# Patient Record
Sex: Female | Born: 1960 | Race: White | Hispanic: No | Marital: Married | Smoking: Former smoker
Health system: Southern US, Community
[De-identification: ages and names within clinical notes are randomized; demographics above are authoritative.]

## PROBLEM LIST (undated history)

## (undated) DIAGNOSIS — M1990 Unspecified osteoarthritis, unspecified site: Secondary | ICD-10-CM

## (undated) DIAGNOSIS — G5600 Carpal tunnel syndrome, unspecified upper limb: Secondary | ICD-10-CM

## (undated) DIAGNOSIS — A6000 Herpesviral infection of urogenital system, unspecified: Secondary | ICD-10-CM

## (undated) DIAGNOSIS — Z87898 Personal history of other specified conditions: Secondary | ICD-10-CM

## (undated) DIAGNOSIS — R51 Headache: Secondary | ICD-10-CM

## (undated) DIAGNOSIS — R519 Headache, unspecified: Secondary | ICD-10-CM

## (undated) DIAGNOSIS — I209 Angina pectoris, unspecified: Secondary | ICD-10-CM

## (undated) DIAGNOSIS — K219 Gastro-esophageal reflux disease without esophagitis: Secondary | ICD-10-CM

## (undated) DIAGNOSIS — K589 Irritable bowel syndrome without diarrhea: Secondary | ICD-10-CM

## (undated) DIAGNOSIS — G4731 Primary central sleep apnea: Secondary | ICD-10-CM

## (undated) DIAGNOSIS — N83209 Unspecified ovarian cyst, unspecified side: Secondary | ICD-10-CM

## (undated) DIAGNOSIS — G43909 Migraine, unspecified, not intractable, without status migrainosus: Secondary | ICD-10-CM

## (undated) DIAGNOSIS — I10 Essential (primary) hypertension: Secondary | ICD-10-CM

## (undated) DIAGNOSIS — R0600 Dyspnea, unspecified: Secondary | ICD-10-CM

## (undated) DIAGNOSIS — J329 Chronic sinusitis, unspecified: Secondary | ICD-10-CM

## (undated) HISTORY — DX: Personal history of other specified conditions: Z87.898

## (undated) HISTORY — DX: Essential (primary) hypertension: I10

## (undated) HISTORY — DX: Carpal tunnel syndrome, unspecified upper limb: G56.00

## (undated) HISTORY — DX: Irritable bowel syndrome, unspecified: K58.9

## (undated) HISTORY — PX: TUBAL LIGATION: SHX77

## (undated) HISTORY — DX: Chronic sinusitis, unspecified: J32.9

## (undated) HISTORY — DX: Unspecified ovarian cyst, unspecified side: N83.209

## (undated) HISTORY — DX: Herpesviral infection of urogenital system, unspecified: A60.00

## (undated) HISTORY — DX: Primary central sleep apnea: G47.31

## (undated) HISTORY — DX: Gastro-esophageal reflux disease without esophagitis: K21.9

## (undated) HISTORY — DX: Unspecified osteoarthritis, unspecified site: M19.90

## (undated) HISTORY — DX: Migraine, unspecified, not intractable, without status migrainosus: G43.909

---
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
## 1984-02-18 HISTORY — PX: CERVICAL CONE BIOPSY: SUR198

## 1999-02-18 HISTORY — PX: ENDOMETRIAL ABLATION: SHX621

## 2000-02-18 HISTORY — PX: TOTAL ABDOMINAL HYSTERECTOMY: SHX209

## 2002-02-17 HISTORY — PX: RHINOPLASTY: SUR1284

## 2003-02-18 HISTORY — PX: ABDOMINAL HYSTERECTOMY: SHX81

## 2003-11-21 ENCOUNTER — Ambulatory Visit: Payer: Self-pay | Admitting: Obstetrics and Gynecology

## 2004-02-18 HISTORY — PX: OTHER SURGICAL HISTORY: SHX169

## 2004-03-21 ENCOUNTER — Ambulatory Visit (HOSPITAL_BASED_OUTPATIENT_CLINIC_OR_DEPARTMENT_OTHER): Admission: RE | Admit: 2004-03-21 | Discharge: 2004-03-21 | Payer: Self-pay | Admitting: Orthopedic Surgery

## 2004-04-24 ENCOUNTER — Ambulatory Visit (HOSPITAL_BASED_OUTPATIENT_CLINIC_OR_DEPARTMENT_OTHER): Admission: RE | Admit: 2004-04-24 | Discharge: 2004-04-24 | Payer: Self-pay | Admitting: Orthopedic Surgery

## 2004-11-26 ENCOUNTER — Ambulatory Visit: Payer: Self-pay | Admitting: Obstetrics and Gynecology

## 2004-12-06 ENCOUNTER — Ambulatory Visit: Payer: Self-pay | Admitting: Obstetrics and Gynecology

## 2005-06-30 ENCOUNTER — Ambulatory Visit: Payer: Self-pay | Admitting: Obstetrics and Gynecology

## 2005-07-04 ENCOUNTER — Ambulatory Visit: Payer: Self-pay | Admitting: Obstetrics and Gynecology

## 2005-12-16 ENCOUNTER — Ambulatory Visit: Payer: Self-pay | Admitting: Obstetrics and Gynecology

## 2006-12-21 ENCOUNTER — Ambulatory Visit: Payer: Self-pay | Admitting: Obstetrics and Gynecology

## 2008-01-11 ENCOUNTER — Ambulatory Visit: Payer: Self-pay | Admitting: Obstetrics and Gynecology

## 2008-04-24 ENCOUNTER — Emergency Department: Payer: Self-pay | Admitting: Internal Medicine

## 2009-05-02 ENCOUNTER — Ambulatory Visit: Payer: Self-pay

## 2009-07-30 ENCOUNTER — Ambulatory Visit: Payer: Self-pay | Admitting: Gastroenterology

## 2009-08-14 ENCOUNTER — Emergency Department: Payer: Self-pay | Admitting: Emergency Medicine

## 2009-08-30 ENCOUNTER — Ambulatory Visit: Payer: Self-pay | Admitting: Internal Medicine

## 2010-05-15 ENCOUNTER — Ambulatory Visit: Payer: Self-pay | Admitting: Internal Medicine

## 2011-05-07 ENCOUNTER — Ambulatory Visit: Payer: Self-pay | Admitting: Gastroenterology

## 2011-05-19 ENCOUNTER — Ambulatory Visit: Payer: Self-pay | Admitting: Internal Medicine

## 2012-02-24 ENCOUNTER — Encounter: Payer: Self-pay | Admitting: Internal Medicine

## 2012-02-24 ENCOUNTER — Ambulatory Visit (INDEPENDENT_AMBULATORY_CARE_PROVIDER_SITE_OTHER): Payer: BC Managed Care – PPO | Admitting: Internal Medicine

## 2012-02-24 VITALS — BP 120/82 | HR 62 | Temp 97.8°F | Ht 62.5 in | Wt 142.2 lb

## 2012-02-24 DIAGNOSIS — K219 Gastro-esophageal reflux disease without esophagitis: Secondary | ICD-10-CM

## 2012-02-24 DIAGNOSIS — I10 Essential (primary) hypertension: Secondary | ICD-10-CM

## 2012-03-08 ENCOUNTER — Encounter: Payer: Self-pay | Admitting: Internal Medicine

## 2012-03-08 NOTE — Progress Notes (Signed)
  Subjective:    Patient ID: Debra Dean, female    DOB: 1960-06-15, 52 y.o.   MRN: [id number]  HPI 52 year old female with past history of allergy and sinus problems, IBS, GERD and hypertension who comes in today for a scheduled follow up.  She states she is doing well.  Handling stress well.  Increased stress with her job.  No significant increased heart rate or palpitations.  No chest pain.  Acid reflux controlled.  Bowels stable.   Past Medical History  Diagnosis Date  . Hypertension   . GERD (gastroesophageal reflux disease)   . IBS (irritable bowel syndrome)   . Recurrent sinusitis   . Carpal tunnel syndrome     mild-mod left, mild right  . Central sleep apnea   . History of abnormal Pap smear     Current Outpatient Prescriptions on File Prior to Visit  Medication Sig Dispense Refill  . amLODipine (NORVASC) 2.5 MG tablet Take 2.5 mg by mouth daily.      Marland Kitchen azelastine (ASTELIN) 137 MCG/SPRAY nasal spray Place 1 spray into the nose 2 (two) times daily. Use in each nostril as directed      . dexlansoprazole (DEXILANT) 60 MG capsule Take 60 mg by mouth daily.      . fluticasone (FLONASE) 50 MCG/ACT nasal spray Place 2 sprays into the nose daily.      . metoprolol tartrate (LOPRESSOR) 25 MG tablet Take 12.5 mg by mouth every morning.      . triamterene-hydrochlorothiazide (MAXZIDE-25) 37.5-25 MG per tablet 1/2 tablet q day        Review of Systems Patient denies any headache, lightheadedness or dizziness. No significant sinus or allergy symptoms.  No chest pain, tightness or palpitations.  No increased shortness of breath, cough or congestion.  No nausea or vomiting.  No increased acid reflux.  Controlled.  No abdominal pain or cramping.  No bowel change, such as diarrhea, constipation, BRBPR or melana.  No urine change.        Objective:   Physical Exam Filed Vitals:   02/24/12 1429  BP: 120/82  Pulse: 62  Temp: 97.8 F (9.29 C)   52 year old female in no acute distress.     HEENT:  Nares - clear.  OP- without lesions or erythema.  NECK:  Supple, nontender.  No audible bruit.   HEART:  Appears to be regular. LUNGS:  Without crackles or wheezing audible.  Respirations even and unlabored.   RADIAL PULSE:  Equal bilaterally.  ABDOMEN:  Soft, nontender.  No audible abdominal bruit.   EXTREMITIES:  No increased edema to be present.                   Assessment & Plan:  PULMONARY.  Last CXR 01/04/10 - clear.  Sleep study - some central sleep apnea.  Discussed with Dr Meredeth Ide.  Avoid sedating medication.  Follow.    CARDIOVASCULAR.  Stable.  Improved.  Follow.    HEALTH MAINTENANCE.  Sees GYN for her pelvic/paps.  Bone density 03/12/10 - normal.  Mammogram 05/19/11 - Birads II.  She is planning to follow up with [facility].  States this is better regarding her insurance.

## 2012-03-08 NOTE — Assessment & Plan Note (Signed)
Blood pressure under good control.  Same med regimen.  Follow.

## 2012-03-08 NOTE — Assessment & Plan Note (Signed)
Symptoms controlled.  Same med regimen.  Follow.

## 2012-09-01 ENCOUNTER — Ambulatory Visit: Payer: Self-pay

## 2013-12-14 ENCOUNTER — Ambulatory Visit: Payer: Self-pay

## 2015-04-05 ENCOUNTER — Other Ambulatory Visit: Payer: Self-pay | Admitting: Unknown Physician Specialty

## 2015-04-05 DIAGNOSIS — Z1231 Encounter for screening mammogram for malignant neoplasm of breast: Secondary | ICD-10-CM

## 2015-04-17 ENCOUNTER — Ambulatory Visit: Payer: Self-pay

## 2015-04-23 ENCOUNTER — Ambulatory Visit
Admission: RE | Admit: 2015-04-23 | Discharge: 2015-04-23 | Disposition: A | Discharge status: Home / Self Care | Payer: Managed Care, Other (non HMO) | Source: Ambulatory Visit | Attending: Unknown Physician Specialty | Admitting: Unknown Physician Specialty

## 2015-04-23 ENCOUNTER — Other Ambulatory Visit: Payer: Self-pay | Admitting: Unknown Physician Specialty

## 2015-04-23 DIAGNOSIS — Z1231 Encounter for screening mammogram for malignant neoplasm of breast: Secondary | ICD-10-CM | POA: Diagnosis not present

## 2016-03-03 ENCOUNTER — Ambulatory Visit: Admit: 2016-03-03 | Payer: Managed Care, Other (non HMO) | Admitting: Gastroenterology

## 2016-03-03 SURGERY — COLONOSCOPY WITH PROPOFOL
Anesthesia: General

## 2016-03-21 ENCOUNTER — Other Ambulatory Visit: Payer: Self-pay | Admitting: Gastroenterology

## 2016-03-21 DIAGNOSIS — R1310 Dysphagia, unspecified: Secondary | ICD-10-CM

## 2016-03-26 ENCOUNTER — Ambulatory Visit: Payer: Managed Care, Other (non HMO)

## 2016-03-26 ENCOUNTER — Ambulatory Visit
Admission: RE | Admit: 2016-03-26 | Discharge: 2016-03-26 | Disposition: A | Discharge status: Home / Self Care | Payer: Managed Care, Other (non HMO) | Source: Ambulatory Visit | Attending: Gastroenterology | Admitting: Gastroenterology

## 2016-03-26 DIAGNOSIS — R1310 Dysphagia, unspecified: Secondary | ICD-10-CM | POA: Diagnosis not present

## 2016-03-27 ENCOUNTER — Other Ambulatory Visit: Payer: Self-pay | Admitting: Gastroenterology

## 2016-03-27 DIAGNOSIS — R1032 Left lower quadrant pain: Secondary | ICD-10-CM

## 2016-03-28 ENCOUNTER — Other Ambulatory Visit: Payer: Managed Care, Other (non HMO)

## 2016-04-07 ENCOUNTER — Ambulatory Visit: Payer: Managed Care, Other (non HMO)

## 2016-04-18 ENCOUNTER — Ambulatory Visit: Payer: Managed Care, Other (non HMO)

## 2016-05-28 ENCOUNTER — Other Ambulatory Visit: Payer: Self-pay | Admitting: Infectious Diseases

## 2016-05-28 DIAGNOSIS — Z1231 Encounter for screening mammogram for malignant neoplasm of breast: Secondary | ICD-10-CM

## 2016-06-20 ENCOUNTER — Other Ambulatory Visit: Payer: Self-pay | Admitting: Neurology

## 2016-06-20 ENCOUNTER — Encounter: Payer: Self-pay | Admitting: *Deleted

## 2016-06-20 DIAGNOSIS — R531 Weakness: Secondary | ICD-10-CM

## 2016-06-23 ENCOUNTER — Ambulatory Visit: Payer: Managed Care, Other (non HMO) | Admitting: Anesthesiology

## 2016-06-23 ENCOUNTER — Encounter: Payer: Self-pay | Admitting: *Deleted

## 2016-06-23 ENCOUNTER — Encounter
Admission: RE | Disposition: A | Discharge status: Home / Self Care | Payer: Self-pay | Source: Ambulatory Visit | Attending: Gastroenterology

## 2016-06-23 ENCOUNTER — Ambulatory Visit
Admission: RE | Admit: 2016-06-23 | Discharge: 2016-06-23 | Disposition: A | Discharge status: Home / Self Care | Payer: Managed Care, Other (non HMO) | Source: Ambulatory Visit | Attending: Gastroenterology | Admitting: Gastroenterology

## 2016-06-23 DIAGNOSIS — Z8673 Personal history of transient ischemic attack (TIA), and cerebral infarction without residual deficits: Secondary | ICD-10-CM | POA: Insufficient documentation

## 2016-06-23 DIAGNOSIS — J329 Chronic sinusitis, unspecified: Secondary | ICD-10-CM | POA: Insufficient documentation

## 2016-06-23 DIAGNOSIS — Z888 Allergy status to other drugs, medicaments and biological substances status: Secondary | ICD-10-CM | POA: Insufficient documentation

## 2016-06-23 DIAGNOSIS — K2980 Duodenitis without bleeding: Secondary | ICD-10-CM | POA: Insufficient documentation

## 2016-06-23 DIAGNOSIS — Z79899 Other long term (current) drug therapy: Secondary | ICD-10-CM | POA: Insufficient documentation

## 2016-06-23 DIAGNOSIS — K589 Irritable bowel syndrome without diarrhea: Secondary | ICD-10-CM | POA: Insufficient documentation

## 2016-06-23 DIAGNOSIS — G709 Myoneural disorder, unspecified: Secondary | ICD-10-CM | POA: Insufficient documentation

## 2016-06-23 DIAGNOSIS — K2950 Unspecified chronic gastritis without bleeding: Secondary | ICD-10-CM | POA: Insufficient documentation

## 2016-06-23 DIAGNOSIS — K5730 Diverticulosis of large intestine without perforation or abscess without bleeding: Secondary | ICD-10-CM | POA: Insufficient documentation

## 2016-06-23 DIAGNOSIS — Z86010 Personal history of colonic polyps: Secondary | ICD-10-CM | POA: Diagnosis not present

## 2016-06-23 DIAGNOSIS — K228 Other specified diseases of esophagus: Secondary | ICD-10-CM | POA: Diagnosis not present

## 2016-06-23 DIAGNOSIS — K210 Gastro-esophageal reflux disease with esophagitis: Secondary | ICD-10-CM | POA: Diagnosis not present

## 2016-06-23 DIAGNOSIS — R194 Change in bowel habit: Secondary | ICD-10-CM | POA: Diagnosis present

## 2016-06-23 DIAGNOSIS — I10 Essential (primary) hypertension: Secondary | ICD-10-CM | POA: Diagnosis not present

## 2016-06-23 DIAGNOSIS — K625 Hemorrhage of anus and rectum: Secondary | ICD-10-CM | POA: Insufficient documentation

## 2016-06-23 DIAGNOSIS — D123 Benign neoplasm of transverse colon: Secondary | ICD-10-CM | POA: Insufficient documentation

## 2016-06-23 DIAGNOSIS — Z87891 Personal history of nicotine dependence: Secondary | ICD-10-CM | POA: Insufficient documentation

## 2016-06-23 DIAGNOSIS — Z885 Allergy status to narcotic agent status: Secondary | ICD-10-CM | POA: Insufficient documentation

## 2016-06-23 DIAGNOSIS — K219 Gastro-esophageal reflux disease without esophagitis: Secondary | ICD-10-CM | POA: Diagnosis present

## 2016-06-23 DIAGNOSIS — Z90710 Acquired absence of both cervix and uterus: Secondary | ICD-10-CM | POA: Diagnosis not present

## 2016-06-23 DIAGNOSIS — Z9889 Other specified postprocedural states: Secondary | ICD-10-CM | POA: Insufficient documentation

## 2016-06-23 DIAGNOSIS — G4731 Primary central sleep apnea: Secondary | ICD-10-CM | POA: Insufficient documentation

## 2016-06-23 DIAGNOSIS — K640 First degree hemorrhoids: Secondary | ICD-10-CM | POA: Diagnosis not present

## 2016-06-23 HISTORY — DX: Angina pectoris, unspecified: I20.9

## 2016-06-23 HISTORY — PX: COLONOSCOPY WITH PROPOFOL: SHX5780

## 2016-06-23 HISTORY — PX: ESOPHAGOGASTRODUODENOSCOPY (EGD) WITH PROPOFOL: SHX5813

## 2016-06-23 HISTORY — DX: Headache, unspecified: R51.9

## 2016-06-23 HISTORY — DX: Headache: R51

## 2016-06-23 HISTORY — DX: Dyspnea, unspecified: R06.00

## 2016-06-23 SURGERY — COLONOSCOPY WITH PROPOFOL
Anesthesia: General

## 2016-06-23 MED ORDER — FENTANYL CITRATE (PF) 100 MCG/2ML IJ SOLN
INTRAMUSCULAR | Status: DC | PRN
  Administered 2016-06-23 (×2): 50 ug via INTRAVENOUS

## 2016-06-23 MED ORDER — GLYCOPYRROLATE 0.2 MG/ML IJ SOLN
INTRAMUSCULAR | Status: DC | PRN
  Administered 2016-06-23: 0.1 mg via INTRAVENOUS

## 2016-06-23 MED ORDER — MIDAZOLAM HCL 2 MG/2ML IJ SOLN
INTRAMUSCULAR | Status: AC
  Filled 2016-06-23: qty 2

## 2016-06-23 MED ORDER — LIDOCAINE HCL (CARDIAC) 20 MG/ML IV SOLN
INTRAVENOUS | Status: DC | PRN
  Administered 2016-06-23: 30 mg via INTRAVENOUS

## 2016-06-23 MED ORDER — PROPOFOL 10 MG/ML IV BOLUS
INTRAVENOUS | Status: AC
  Filled 2016-06-23: qty 20

## 2016-06-23 MED ORDER — SODIUM CHLORIDE 0.9 % IV SOLN: INTRAVENOUS | Status: DC

## 2016-06-23 MED ORDER — SODIUM CHLORIDE 0.9 % IV SOLN
INTRAVENOUS | Status: DC
  Administered 2016-06-23: 07:00:00 via INTRAVENOUS

## 2016-06-23 MED ORDER — PROPOFOL 500 MG/50ML IV EMUL
INTRAVENOUS | Status: DC | PRN
  Administered 2016-06-23: 120 ug/kg/min via INTRAVENOUS

## 2016-06-23 MED ORDER — FENTANYL CITRATE (PF) 100 MCG/2ML IJ SOLN
INTRAMUSCULAR | Status: AC
  Filled 2016-06-23: qty 2

## 2016-06-23 MED ORDER — PROPOFOL 500 MG/50ML IV EMUL
INTRAVENOUS | Status: AC
  Filled 2016-06-23: qty 50

## 2016-06-23 MED ORDER — MIDAZOLAM HCL 2 MG/2ML IJ SOLN
INTRAMUSCULAR | Status: DC | PRN
  Administered 2016-06-23: 2 mg via INTRAVENOUS

## 2016-06-23 NOTE — H&P (Signed)
Outpatient short stay form Pre-procedure 06/23/2016 7:41 AM Lollie Sails MD  Primary Physician: Dr. Adrian Prows  Reason for visit:  EGD and colonoscopy  History of present illness:  Patient is a 56 year old female presenting today as above. She has personal history of esophageal reflux and has noted over the past year or so that she's had to take antiacids more frequently and almost every evening. Has been taking her rabeprazole daily however in discussion with her she is likely not taking it to get the best affect from the medication. We discussed at length today. She also had an episode of some rectal bleeding in the setting of known history of adenomatous colon polyps. Been 1 episode but it was marked for her. She also has some symptoms sound like irritable bowel. She tolerated her prep well. She takes no aspirin or blood thinning agents.    Current Facility-Administered Medications:  .  0.9 %  sodium chloride infusion, , Intravenous, Continuous, Lollie Sails, MD, Last Rate: 20 mL/hr at 06/23/16 5035 .  0.9 %  sodium chloride infusion, , Intravenous, Continuous, Lollie Sails, MD  Prescriptions Prior to Admission  Medication Sig Dispense Refill Last Dose  . amLODipine (NORVASC) 2.5 MG tablet Take 2.5 mg by mouth daily.   06/23/2016 at Unknown time  . cholecalciferol (VITAMIN D) 400 units TABS tablet Take 1,000 Units by mouth.   Past Week at Unknown time  . fluticasone (FLONASE) 50 MCG/ACT nasal spray Place 2 sprays into the nose daily.   06/22/2016 at Unknown time  . metoprolol tartrate (LOPRESSOR) 25 MG tablet Take 12.5 mg by mouth every morning.   06/23/2016 at Unknown time  . potassium chloride (K-DUR) 10 MEQ tablet Take 10 mEq by mouth daily.   06/22/2016 at Unknown time  . RABEprazole (ACIPHEX) 20 MG tablet Take 20 mg by mouth daily.   06/22/2016 at Unknown time  . triamterene-hydrochlorothiazide (MAXZIDE-25) 37.5-25 MG per tablet 1/2 tablet q day   06/23/2016 at Unknown time   . ALPRAZolam (XANAX) 0.25 MG tablet 1/2 tablet qhs prn   Not Taking at Unknown time  . azelastine (ASTELIN) 137 MCG/SPRAY nasal spray Place 1 spray into the nose 2 (two) times daily. Use in each nostril as directed   Not Taking at Unknown time  . dexlansoprazole (DEXILANT) 60 MG capsule Take 60 mg by mouth daily.   Not Taking at Unknown time     Allergies  Allergen Reactions  . Codeine   . Ace Inhibitors Rash     Past Medical History:  Diagnosis Date  . Anginal pain ([place])   . Carpal tunnel syndrome    mild-mod left, mild right  . Central sleep apnea   . Dyspnea    had stress test and negative having another sleep study and MRI of brain  . GERD (gastroesophageal reflux disease)   . Headache   . History of abnormal Pap smear   . Hypertension   . IBS (irritable bowel syndrome)   . Recurrent sinusitis     Review of systems:      Physical Exam    Heart and lungs: Regular rate and rhythm without rub or gallop, lungs are bilaterally clear.    HEENT: Normocephalic atraumatic eyes are anicteric    Other:     Pertinant exam for procedure: Soft nontender nondistended bowel sounds positive normoactive.    Planned proceedures: EGD, colonoscopy and indicated procedures. I have discussed the risks benefits and complications of procedures to include  not limited to bleeding, infection, perforation and the risk of sedation and the patient wishes to proceed.    Lollie Sails, MD Gastroenterology 06/23/2016  7:41 AM

## 2016-06-23 NOTE — Op Note (Addendum)
[facility] Gastroenterology Patient Name: Debra Dean Procedure Date: 06/23/2016 7:40 AM MRN: [id number] Account #: [id number] Date of Birth: October 04, 1960 Admit Type: Outpatient Age: 56 Room: [facility] ENDO ROOM 3 Gender: Female Note Status: Finalized Procedure:            Colonoscopy Indications:          Rectal bleeding, Personal history of colonic polyps Providers:            Lollie Sails, MD Referring MD:         No Local Md, MD (Referring MD) Complications:        No immediate complications. Procedure:            Pre-Anesthesia Assessment:                       - ASA Grade Assessment: II - A patient with mild                        systemic disease.                       After obtaining informed consent, the colonoscope was                        passed under direct vision. Throughout the procedure,                        the patient's blood pressure, pulse, and oxygen                        saturations were monitored continuously. The                        Colonoscope was introduced through the anus and                        advanced to the the cecum, identified by appendiceal                        orifice and ileocecal valve. The colonoscopy was                        performed without difficulty. The patient tolerated the                        procedure well. The quality of the bowel preparation                        was good. Findings:      A few small-mouthed diverticula were found in the sigmoid colon and       descending colon.      A 2 mm polyp was found in the hepatic flexure. The polyp was sessile.       The polyp was removed with a cold biopsy forceps. Resection and       retrieval were complete.      Biopsies for histology were taken with a cold forceps from the right       colon and left colon for evaluation of microscopic colitis.      Non-bleeding internal hemorrhoids were found during retroflexion. The       hemorrhoids were small  and  Grade I (internal hemorrhoids that do not       prolapse).      No additional abnormalities were found on retroflexion. Impression:           - Diverticulosis in the sigmoid colon and in the                        descending colon.                       - One 2 mm polyp at the hepatic flexure, removed with a                        cold biopsy forceps. Resected and retrieved.                       - Non-bleeding internal hemorrhoids.                       - Biopsies were taken with a cold forceps from the                        right colon and left colon for evaluation of                        microscopic colitis. Recommendation:       - Discharge patient to home. Procedure Code(s):    --- Professional ---                       [phone], Colonoscopy, flexible; with biopsy, single or                        multiple Diagnosis Code(s):    --- Professional ---                       K64.0, First degree hemorrhoids                       D12.3, Benign neoplasm of transverse colon (hepatic                        flexure or splenic flexure)                       K62.5, Hemorrhage of anus and rectum                       Z86.010, Personal history of colonic polyps                       K57.30, Diverticulosis of large intestine without                        perforation or abscess without bleeding CPT copyright 2016 American Medical Association. All rights reserved. The codes documented in this report are preliminary and upon coder review may  be revised to meet current compliance requirements. Lollie Sails, MD 06/23/2016 8:30:56 AM This report has been signed electronically. Number of Addenda: 0 Note Initiated On: 06/23/2016 7:40 AM Scope Withdrawal Time: 0 hours 8 minutes 34 seconds  Total Procedure Duration: 0 hours 15 minutes 58 seconds  [facility]

## 2016-06-23 NOTE — Anesthesia Postprocedure Evaluation (Signed)
Anesthesia Post Note  Patient: Debra Dean  Procedure(s) Performed: Procedure(s) (LRB): COLONOSCOPY WITH PROPOFOL (N/A) ESOPHAGOGASTRODUODENOSCOPY (EGD) WITH PROPOFOL (N/A)  Patient location during evaluation: Endoscopy Anesthesia Type: General Level of consciousness: awake and alert Pain management: pain level controlled Vital Signs Assessment: post-procedure vital signs reviewed and stable Respiratory status: spontaneous breathing, nonlabored ventilation, respiratory function stable and patient connected to nasal cannula oxygen Cardiovascular status: blood pressure returned to baseline and stable Postop Assessment: no signs of nausea or vomiting Anesthetic complications: no     Last Vitals:  Vitals:   06/23/16 0857 06/23/16 0907  BP: 120/75 106/60  Pulse: (!) 51 65  Resp: 12 13  Temp:      Last Pain:  Vitals:   06/23/16 0837  TempSrc: Tympanic                 Precious Haws Ashlen Kiger

## 2016-06-23 NOTE — Op Note (Signed)
[facility] Gastroenterology Patient Name: Debra Dean Procedure Date: 06/23/2016 7:41 AM MRN: [id number] Account #: [id number] Date of Birth: January 21, 1961 Admit Type: Outpatient Age: 56 Room: [facility] ENDO ROOM 3 Gender: Female Note Status: Finalized Procedure:            Upper GI endoscopy Indications:          Esophageal reflux symptoms that persist despite                        appropriate therapy Providers:            Lollie Sails, MD Referring MD:         No Local Md, MD (Referring MD) Medicines:            Monitored Anesthesia Care Complications:        No immediate complications. Procedure:            Pre-Anesthesia Assessment:                       - ASA Grade Assessment: II - A patient with mild                        systemic disease.                       After obtaining informed consent, the endoscope was                        passed under direct vision. Throughout the procedure,                        the patient's blood pressure, pulse, and oxygen                        saturations were monitored continuously. The Endoscope                        was introduced through the mouth, and advanced to the                        third part of duodenum. The upper GI endoscopy was                        accomplished without difficulty. The patient tolerated                        the procedure well. Findings:      The Z-line was variable. Biopsies were taken with a cold forceps for       histology.      The exam of the esophagus was otherwise normal.      patulous GE junction      Patchy mild inflammation characterized by erosions and erythema was       found in the gastric antrum. Biopsies were taken with a cold forceps for       histology. Biopsies were taken with a cold forceps for Helicobacter       pylori testing.      The cardia and gastric fundus were normal on retroflexion.      Patchy mild inflammation characterized by congestion (edema) and        erythema was found  in the duodenal bulb. Impression:           - Z-line variable. Biopsied.                       - Erosive gastritis. Biopsied.                       - Duodenitis. Recommendation:       - Await pathology results.                       - Continue present medications.                       - Return to GI clinic in 3 weeks. Procedure Code(s):    --- Professional ---                       [phone], Esophagogastroduodenoscopy, flexible, transoral;                        with biopsy, single or multiple Diagnosis Code(s):    --- Professional ---                       K22.8, Other specified diseases of esophagus                       K29.60, Other gastritis without bleeding                       K29.80, Duodenitis without bleeding                       K21.9, Gastro-esophageal reflux disease without                        esophagitis CPT copyright 2016 American Medical Association. All rights reserved. The codes documented in this report are preliminary and upon coder review may  be revised to meet current compliance requirements. Lollie Sails, MD 06/23/2016 8:08:09 AM This report has been signed electronically. Number of Addenda: 0 Note Initiated On: 06/23/2016 7:41 AM      [facility]

## 2016-06-23 NOTE — Anesthesia Procedure Notes (Signed)
Performed by: COOK-MARTIN, Rochella Benner Pre-anesthesia Checklist: Patient identified, Emergency Drugs available, Suction available, Patient being monitored and Timeout performed Patient Re-evaluated:Patient Re-evaluated prior to inductionOxygen Delivery Method: Nasal cannula Preoxygenation: Pre-oxygenation with 100% oxygen Intubation Type: IV induction Airway Equipment and Method: Bite block Placement Confirmation: CO2 detector and positive ETCO2     

## 2016-06-23 NOTE — Anesthesia Post-op Follow-up Note (Cosign Needed)
Anesthesia QCDR form completed.        

## 2016-06-23 NOTE — Anesthesia Preprocedure Evaluation (Signed)
Anesthesia Evaluation  Patient identified by MRN, date of birth, ID band Patient awake    Reviewed: Allergy & Precautions, H&P , NPO status , Patient's Chart, lab work & pertinent test results  History of Anesthesia Complications Negative for: history of anesthetic complications  Airway Mallampati: III  TM Distance: <3 FB Neck ROM: full    Dental  (+) Chipped, Caps   Pulmonary shortness of breath and with exertion, former smoker,    Pulmonary exam normal breath sounds clear to auscultation       Cardiovascular Exercise Tolerance: Good hypertension, (-) anginaNormal cardiovascular exam Rhythm:regular Rate:Normal     Neuro/Psych  Headaches, TIA Neuromuscular disease negative psych ROS   GI/Hepatic Neg liver ROS, GERD  Controlled,  Endo/Other  negative endocrine ROS  Renal/GU negative Renal ROS  negative genitourinary   Musculoskeletal   Abdominal   Peds  Hematology negative hematology ROS (+)   Anesthesia Other Findings Past Medical History: No date: Anginal pain ([facility]) No date: Carpal tunnel syndrome     Comment: mild-mod left, mild right No date: Central sleep apnea No date: Dyspnea     Comment: had stress test and negative having another               sleep study and MRI of brain No date: GERD (gastroesophageal reflux disease) No date: Headache No date: History of abnormal Pap smear No date: Hypertension No date: IBS (irritable bowel syndrome) No date: Recurrent sinusitis  Past Surgical History: 2005: ABDOMINAL HYSTERECTOMY 2006: bilateral carpal tunnel release 2004: RHINOPLASTY 1965: TONSILLECTOMY No date: TUBAL LIGATION  BMI    Body Mass Index:  24.48 kg/m      Reproductive/Obstetrics negative OB ROS                             Anesthesia Physical Anesthesia Plan  ASA: III  Anesthesia Plan: General   Post-op Pain Management:    Induction: Intravenous  Airway  Management Planned: Natural Airway and Nasal Cannula  Additional Equipment:   Intra-op Plan:   Post-operative Plan:   Informed Consent: I have reviewed the patients History and Physical, chart, labs and discussed the procedure including the risks, benefits and alternatives for the proposed anesthesia with the patient or authorized representative who has indicated his/her understanding and acceptance.   Dental Advisory Given  Plan Discussed with: Anesthesiologist, CRNA and Surgeon  Anesthesia Plan Comments: (Patient consented for risks of anesthesia including but not limited to:  - adverse reactions to medications - damage to teeth, lips or other oral mucosa - sore throat or hoarseness - Damage to heart, brain, lungs or loss of life  Patient voiced understanding.)        Anesthesia Quick Evaluation

## 2016-06-23 NOTE — Transfer of Care (Signed)
Immediate Anesthesia Transfer of Care Note  Patient: Debra Dean  Procedure(s) Performed: Procedure(s): COLONOSCOPY WITH PROPOFOL (N/A) ESOPHAGOGASTRODUODENOSCOPY (EGD) WITH PROPOFOL (N/A)  Patient Location: PACU  Anesthesia Type:General  Level of Consciousness: awake and sedated  Airway & Oxygen Therapy: Patient Spontanous Breathing and Patient connected to nasal cannula oxygen  Post-op Assessment: Report given to RN and Post -op Vital signs reviewed and stable  Post vital signs: Reviewed and stable  Last Vitals:  Vitals:   06/23/16 0653  BP: (!) 123/91  Pulse: 72  Resp: 16  Temp: (!) 36 C    Last Pain:  Vitals:   06/23/16 0653  TempSrc: Oral         Complications: No apparent anesthesia complications

## 2016-06-24 ENCOUNTER — Ambulatory Visit
Admission: RE | Admit: 2016-06-24 | Discharge: 2016-06-24 | Disposition: A | Discharge status: Home / Self Care | Payer: Managed Care, Other (non HMO) | Source: Ambulatory Visit | Attending: Infectious Diseases | Admitting: Infectious Diseases

## 2016-06-24 ENCOUNTER — Encounter: Payer: Self-pay | Admitting: Gastroenterology

## 2016-06-24 DIAGNOSIS — Z1231 Encounter for screening mammogram for malignant neoplasm of breast: Secondary | ICD-10-CM | POA: Diagnosis present

## 2016-06-24 LAB — SURGICAL PATHOLOGY

## 2016-07-01 ENCOUNTER — Ambulatory Visit (INDEPENDENT_AMBULATORY_CARE_PROVIDER_SITE_OTHER): Payer: Managed Care, Other (non HMO) | Admitting: Obstetrics and Gynecology

## 2016-07-01 ENCOUNTER — Encounter: Payer: Self-pay | Admitting: Obstetrics and Gynecology

## 2016-07-01 VITALS — BP 122/66 | HR 66 | Ht 62.0 in | Wt 142.0 lb

## 2016-07-01 DIAGNOSIS — Z01419 Encounter for gynecological examination (general) (routine) without abnormal findings: Secondary | ICD-10-CM | POA: Diagnosis not present

## 2016-07-01 DIAGNOSIS — N951 Menopausal and female climacteric states: Secondary | ICD-10-CM

## 2016-07-01 NOTE — Progress Notes (Signed)
Chief Complaint  Patient presents with  . Gynecologic Exam    lumpiness in belly    HPI:      Debra Dean is a 56 y.o. G1P1 who LMP was No LMP recorded. Patient has had a hysterectomy., presents today for her annual examination.  Her menses are absent due to hysterectomy. She does not have intermenstrual bleeding.  She does not have vasomotor sx.   Sex activity: single partner, contraception - status post hysterectomy. She does have vaginal dryness and sometimes a small amount of red blood with sex. She has tried [place] with some relief. She can't tolerate oral, topical or patch ERT due to headaches.  Last Pap: April 03, 2015  Results were: no abnormalities /neg HPV DNA.   Last mammogram: 06/24/16. Results were: normal--routine follow-up in 12 months There is a FH of breast cancer in her mat aunt, genetic testing not indicated. There is no FH of ovarian cancer. The patient does not do self-breast exams.  Colonoscopy: colonoscopy last wk with 1 polyp. Due again in 5 yrs.    Tobacco use: The patient denies current or previous tobacco use. Alcohol use: none Exercise: not active  She does get adequate calcium and Vitamin D in her diet. She has labs with PCP.  Past Medical History:  Diagnosis Date  . Anginal pain ([place])   . Carpal tunnel syndrome    mild-mod left, mild right  . Central sleep apnea   . Dyspnea    had stress test and negative having another sleep study and MRI of brain  . GERD (gastroesophageal reflux disease)   . Headache   . History of abnormal Pap smear   . Hypertension   . IBS (irritable bowel syndrome)   . Recurrent sinusitis     Past Surgical History:  Procedure Laterality Date  . ABDOMINAL HYSTERECTOMY  2005  . bilateral carpal tunnel release  2006  . COLONOSCOPY WITH PROPOFOL N/A 06/23/2016   Procedure: COLONOSCOPY WITH PROPOFOL;  Surgeon: Lollie Sails, MD;  Location: [facility] ENDOSCOPY;  Service: Endoscopy;  Laterality: N/A;  .  ESOPHAGOGASTRODUODENOSCOPY (EGD) WITH PROPOFOL N/A 06/23/2016   Procedure: ESOPHAGOGASTRODUODENOSCOPY (EGD) WITH PROPOFOL;  Surgeon: Lollie Sails, MD;  Location: [facility] ENDOSCOPY;  Service: Endoscopy;  Laterality: N/A;  . RHINOPLASTY  2004  . TONSILLECTOMY  1965  . TUBAL LIGATION      Family History  Problem Relation Age of Onset  . Hypertension Mother   . Endometriosis Sister   . Asthma Brother   . Colon cancer Unknown        grandfather  . Uterine cancer Unknown        aunt  . Breast cancer Maternal Aunt        70's    Social History   Social History  . Marital status: Married    Spouse name: N/A  . Number of children: 1  . Years of education: N/A   Occupational History  . Not on file.   Social History Main Topics  . Smoking status: Former Smoker    Quit date: 02/18/1995  . Smokeless tobacco: Never Used  . Alcohol use No  . Drug use: No  . Sexual activity: Yes   Other Topics Concern  . Not on file   Social History Narrative  . No narrative on file     Current Outpatient Prescriptions:  .  ALPRAZolam (XANAX) 0.25 MG tablet, 1/2 tablet qhs prn, Disp: , Rfl:  .  amLODipine (  NORVASC) 2.5 MG tablet, Take 2.5 mg by mouth daily., Disp: , Rfl:  .  azelastine (ASTELIN) 137 MCG/SPRAY nasal spray, Place 1 spray into the nose 2 (two) times daily. Use in each nostril as directed, Disp: , Rfl:  .  cholecalciferol (VITAMIN D) 400 units TABS tablet, Take 1,000 Units by mouth., Disp: , Rfl:  .  dexlansoprazole (DEXILANT) 60 MG capsule, Take 60 mg by mouth daily., Disp: , Rfl:  .  fluticasone (FLONASE) 50 MCG/ACT nasal spray, Place 2 sprays into the nose daily., Disp: , Rfl:  .  metoprolol tartrate (LOPRESSOR) 25 MG tablet, Take 12.5 mg by mouth every morning., Disp: , Rfl:  .  potassium chloride (K-DUR) 10 MEQ tablet, Take 10 mEq by mouth daily., Disp: , Rfl:  .  RABEprazole (ACIPHEX) 20 MG tablet, Take 20 mg by mouth daily., Disp: , Rfl:  .  triamterene-hydrochlorothiazide  (MAXZIDE-25) 37.5-25 MG per tablet, 1/2 tablet q day, Disp: , Rfl:    ROS:  Review of Systems  Constitutional: Positive for fatigue. Negative for fever and unexpected weight change.  Respiratory: Positive for shortness of breath and wheezing. Negative for cough.   Cardiovascular: Negative for chest pain, palpitations and leg swelling.  Gastrointestinal: Negative for blood in stool, constipation, diarrhea, nausea and vomiting.  Endocrine: Negative for cold intolerance, heat intolerance and polyuria.  Genitourinary: Positive for dyspareunia and vaginal bleeding. Negative for dysuria, flank pain, frequency, genital sores, hematuria, menstrual problem, pelvic pain, urgency, vaginal discharge and vaginal pain.  Musculoskeletal: Negative for back pain, joint swelling and myalgias.  Skin: Negative for rash.  Neurological: Negative for dizziness, syncope, light-headedness, numbness and headaches.  Hematological: Negative for adenopathy.  Psychiatric/Behavioral: Negative for agitation, confusion, sleep disturbance and suicidal ideas. The patient is not nervous/anxious.      Objective: BP 122/66 (BP Location: Left Arm, Patient Position: Sitting, Cuff Size: Normal)   Pulse 66   Ht 5\' 2"  (1.575 m)   Wt 142 lb (64.4 kg)   BMI 25.97 kg/m    Physical Exam  Constitutional: She is oriented to person, place, and time. She appears well-developed and well-nourished.  Genitourinary: Vagina normal. There is no rash or tenderness on the right labia. There is no rash or tenderness on the left labia. No erythema or tenderness in the vagina. No vaginal discharge found. Right adnexum does not display mass and does not display tenderness. Left adnexum does not display mass and does not display tenderness.  Genitourinary Comments: UTERUS/CX SURG ABSENT  Neck: Normal range of motion. No thyromegaly present.  Cardiovascular: Normal rate, regular rhythm and normal heart sounds.   No murmur  heard. Pulmonary/Chest: Effort normal and breath sounds normal. Right breast exhibits no mass, no nipple discharge, no skin change and no tenderness. Left breast exhibits no mass, no nipple discharge, no skin change and no tenderness.  Abdominal: Soft. There is no tenderness. There is no guarding.  Musculoskeletal: Normal range of motion.  Neurological: She is alert and oriented to person, place, and time. No cranial nerve deficit.  Psychiatric: She has a normal mood and affect. Her behavior is normal.  Vitals reviewed.   Assessment/Plan: Encounter for annual routine gynecological examination  Vaginal dryness, menopausal - Try coconut oil since can't tolerate vag ERT.         GYN counsel mammography screening, menopause, adequate intake of calcium and vitamin D, diet and exercise     F/U  Return in about 1 year (around 07/01/2017).  Maevis Mumby B. Thimothy Barretta, PA-C  07/01/2016 4:30 PM

## 2016-07-07 ENCOUNTER — Ambulatory Visit
Admission: RE | Admit: 2016-07-07 | Discharge: 2016-07-07 | Disposition: A | Discharge status: Home / Self Care | Payer: Managed Care, Other (non HMO) | Source: Ambulatory Visit | Attending: Neurology | Admitting: Neurology

## 2016-07-07 DIAGNOSIS — R531 Weakness: Secondary | ICD-10-CM

## 2016-07-07 MED ORDER — GADOBENATE DIMEGLUMINE 529 MG/ML IV SOLN
12.0000 mL | Freq: Once | INTRAVENOUS | Status: AC | PRN
  Administered 2016-07-07: 12 mL via INTRAVENOUS

## 2016-07-16 ENCOUNTER — Other Ambulatory Visit: Payer: Managed Care, Other (non HMO)

## 2016-09-03 ENCOUNTER — Ambulatory Visit: Payer: 59 | Attending: Neurology

## 2016-09-03 DIAGNOSIS — G4761 Periodic limb movement disorder: Secondary | ICD-10-CM | POA: Insufficient documentation

## 2016-09-03 DIAGNOSIS — Z1389 Encounter for screening for other disorder: Secondary | ICD-10-CM | POA: Diagnosis present

## 2017-08-05 ENCOUNTER — Other Ambulatory Visit: Payer: Self-pay | Admitting: Infectious Diseases

## 2017-08-05 DIAGNOSIS — Z1231 Encounter for screening mammogram for malignant neoplasm of breast: Secondary | ICD-10-CM

## 2017-08-26 ENCOUNTER — Ambulatory Visit
Admission: RE | Admit: 2017-08-26 | Discharge: 2017-08-26 | Disposition: A | Discharge status: Home / Self Care | Payer: 59 | Source: Ambulatory Visit | Attending: Infectious Diseases | Admitting: Infectious Diseases

## 2017-08-26 DIAGNOSIS — Z1231 Encounter for screening mammogram for malignant neoplasm of breast: Secondary | ICD-10-CM | POA: Diagnosis not present

## 2017-12-23 ENCOUNTER — Ambulatory Visit: Payer: 59 | Admitting: Obstetrics and Gynecology

## 2018-02-25 ENCOUNTER — Encounter: Payer: Managed Care, Other (non HMO) | Attending: Family Medicine | Admitting: Dietician

## 2018-02-25 ENCOUNTER — Encounter: Payer: Self-pay | Admitting: Dietician

## 2018-02-25 VITALS — Ht 62.5 in | Wt 145.1 lb

## 2018-02-25 DIAGNOSIS — Z6826 Body mass index (BMI) 26.0-26.9, adult: Secondary | ICD-10-CM | POA: Insufficient documentation

## 2018-02-25 DIAGNOSIS — R7303 Prediabetes: Secondary | ICD-10-CM | POA: Diagnosis not present

## 2018-02-25 DIAGNOSIS — Z713 Dietary counseling and surveillance: Secondary | ICD-10-CM | POA: Insufficient documentation

## 2018-02-25 NOTE — Progress Notes (Signed)
Medical Nutrition Therapy: Visit start time: 1630  end time: 1730  Assessment:  Diagnosis: pre-diabetes Past medical history: COPD, HTN, GERD Psychosocial issues/ stress concerns: none  Preferred learning method:  . Hands-on   Current weight: 145.1lbs Height: 5'2.5" Medications, supplements: reconciled list in medical record  Progress and evaluation: Patient reports HbA1C tested in December was 5.8%, will be retested in May. Reports strong family history for diabetes. Patient has been reducing intake of breads and sweets, carbohydrates; eating more fruits and vegetables and protein sources.   Physical activity: elliptical and pilates 10-20 minutes, 1 or more days a week (was doing 5 days a week). Plans to resume and wants to try 2x a day. Time on elliptical is limited due to COPD  Dietary Intake:  Usual eating pattern includes 3 meals and 2-3 snacks per day. Dining out frequency: 2-3 meals per week.(weekends)  Breakfast: egg omelet with veg, coffee Snack: decaf coffee at work; occasional snack of 1 small cookie (used to eat 4) Lunch: more salads recently with chicken strips (grilled), less croutons sub nuts, Ranch; or leftovers Snack: apples Supper: largest meal-- grilled pork chops with potato and salad; country style steak mashed potatoes or mac and cheese; corn peas, brussels sprouts broccoli, cabbage, rice Snack: occasional popcorn (1/2 bag); dark chocolate 2 squares; tries to limit chips cheetos Beverages: water, coffee with sugar and flavored creamer  Nutrition Care Education: Topics covered: diabetes prevention Basic nutrition: basic food groups, appropriate nutrient balance, appropriate meal and snack schedule, general nutrition guidelines    Weight control: benefits of weight control; estimated nutrition needs at about 1300kcal daily for some weight loss and provided guidance for carbohydrate and protein needs.  Diabetes: appropriate meal and snack schedule, appropriate carb  intake and balance, plate method for planning balanced meals, importance of low sugar and low fat food choices, role of protein and fiber. Importance of physical activity in controlling insulin resistance and body weight/ composition.   Nutritional Diagnosis:  [name]-2.2 Altered nutrition-related laboratory As related to pre-diabetes.  As evidenced by recent HbA1C of 5.8%.  Intervention:   Instruction as noted above.  Patient has already been working on positive and appropriate diet changes.   She is motivated to continue improving physical activity.   No follow-up needed at this time, patient will schedule later if needed.   Education Materials given:  . General diet guidelines for Diabetes (prevention) . Plate Planner with food lists . Sample meal pattern/ menus . Combination (and fast foods) list . Goals/ instructions   Learner/ who was taught:  . Patient   Level of understanding: Marland Kitchen Verbalizes/ demonstrates competency   Demonstrated degree of understanding via:   Teach back Learning barriers: . None  Willingness to learn/ readiness for change: . Eager, change in progress   Monitoring and Evaluation:  Dietary intake, exercise, BG control, and body weight      follow up: prn

## 2018-02-25 NOTE — Patient Instructions (Signed)
   Continue to plan balanced meals and control carbohydrate portions, great job!  Keep building regular exercise/ daily movement gradually as able to help control insulin resistance and diabetes risk.

## 2018-03-15 ENCOUNTER — Encounter: Payer: Self-pay | Admitting: Obstetrics and Gynecology

## 2018-03-15 ENCOUNTER — Ambulatory Visit (INDEPENDENT_AMBULATORY_CARE_PROVIDER_SITE_OTHER): Payer: Managed Care, Other (non HMO) | Admitting: Obstetrics and Gynecology

## 2018-03-15 VITALS — BP 140/68 | HR 73 | Ht 62.5 in | Wt 145.0 lb

## 2018-03-15 DIAGNOSIS — N760 Acute vaginitis: Secondary | ICD-10-CM

## 2018-03-15 LAB — POCT WET PREP WITH KOH
CLUE CELLS WET PREP PER HPF POC: NEGATIVE
KOH Prep POC: NEGATIVE
Trichomonas, UA: NEGATIVE
Yeast Wet Prep HPF POC: NEGATIVE

## 2018-03-15 MED ORDER — FLUCONAZOLE 150 MG PO TABS
150.0000 mg | ORAL_TABLET | Freq: Once | ORAL | 0 refills | Status: AC
Start: 2018-03-15 — End: 2018-03-15

## 2018-03-15 NOTE — Patient Instructions (Signed)
I value your feedback and entrusting us with your care. If you get a [facility] patient survey, I would appreciate you taking the time to let us know about your experience today. Thank you! 

## 2018-03-15 NOTE — Progress Notes (Signed)
[name], MD   Chief Complaint  Patient presents with  . Vaginitis    itching and burning, no discharge/odor, x 4 days just finished prednisone     HPI:      [name] is a 58 y.o. G1P1 who LMP was No LMP recorded. Patient has had a hysterectomy., presents today for vaginal itching/burning for 4 days without increased d/c, odor. Sx started after prednisone use. No recent abx use. Pt treated with monistat-1 ovule several nights ago with some sx improvement. Pt with hx of genital HSV but sx don't feel like that. No LBP, belly pain, fevers, no urin sx except dysuria.  Has annual sched 2/20.  Past Medical History:  Diagnosis Date  . Anginal pain ([place])   . Carpal tunnel syndrome    mild-mod left, mild right  . Central sleep apnea   . Dyspnea    had stress test and negative having another sleep study and MRI of brain  . GERD (gastroesophageal reflux disease)   . Headache   . Herpes genitalis   . History of abnormal Pap smear   . Hypertension   . IBS (irritable bowel syndrome)   . Migraine   . Osteoarthritis   . Ovarian cyst   . Recurrent sinusitis     Past Surgical History:  Procedure Laterality Date  . ABDOMINAL HYSTERECTOMY  2005  . bilateral carpal tunnel release  2006  . CERVICAL [place]E BIOPSY  1986  . COLONOSCOPY WITH PROPOFOL N/A 06/23/2016   Procedure: COLONOSCOPY WITH PROPOFOL;  Surgeon: Lollie Sails, MD;  Location: [facility] ENDOSCOPY;  Service: Endoscopy;  Laterality: N/A;  . ENDOMETRIAL ABLATION  2001  . ESOPHAGOGASTRODUODENOSCOPY (EGD) WITH PROPOFOL N/A 06/23/2016   Procedure: ESOPHAGOGASTRODUODENOSCOPY (EGD) WITH PROPOFOL;  Surgeon: Lollie Sails, MD;  Location: [facility] ENDOSCOPY;  Service: Endoscopy;  Laterality: N/A;  . RHINOPLASTY  2004  . TONSILLECTOMY  1965  . TOTAL ABDOMINAL HYSTERECTOMY  2002   LSO has right ovary and tube, MAD  . TUBAL LIGATION      Family History  Problem Relation Age of Onset  . Hypertension Mother   .  Endometriosis Sister   . Asthma Brother   . Colon cancer Other        grandfather  . Uterine cancer Other        aunt  . Breast cancer Maternal Aunt        70's  . Diabetes Father        DM Type 2  . Hypertension Father   . Prostate cancer Father   . Colon cancer Maternal Grandfather 69  . Uterine cancer Other 63       Malignant    Social History   Socioeconomic History  . Marital status: Married    Spouse name: Not on file  . Number of children: 1  . Years of education: Not on file  . Highest education level: Not on file  Occupational History  . Not on file  Social Needs  . Financial resource strain: Not on file  . Food insecurity:    Worry: Not on file    Inability: Not on file  . Transportation needs:    Medical: Not on file    Non-medical: Not on file  Tobacco Use  . Smoking status: Former Smoker    Last attempt to quit: 02/18/1995    Years since quitting: 23.0  . Smokeless tobacco: Never Used  Substance and Sexual Activity  .  Alcohol use: No  . Drug use: No  . Sexual activity: Yes    Birth control/protection: Surgical    Comment: Hysterectomy  Lifestyle  . Physical activity:    Days per week: Not on file    Minutes per session: Not on file  . Stress: Not on file  Relationships  . Social connections:    Talks on phone: Not on file    Gets together: Not on file    Attends religious service: Not on file    Active member of club or organization: Not on file    Attends meetings of clubs or organizations: Not on file    Relationship status: Not on file  . Intimate partner violence:    Fear of current or ex partner: Not on file    Emotionally abused: Not on file    Physically abused: Not on file    Forced sexual activity: Not on file  Other Topics Concern  . Not on file  Social History Narrative  . Not on file    Outpatient Medications Prior to Visit  Medication Sig Dispense Refill  . albuterol (PROVENTIL HFA;VENTOLIN HFA) 108 (90 Base) MCG/ACT  inhaler Inhale into the lungs.    Marland Kitchen aluminum hydroxide-magnesium carbonate (GAVISCON) 95-358 MG/15ML SUSP Take by mouth.    Marland Kitchen amLODipine (NORVASC) 2.5 MG tablet Take 2.5 mg by mouth daily.    . cholecalciferol (VITAMIN D) 400 units TABS tablet Take 1,000 Units by mouth.    . fluticasone (FLONASE) 50 MCG/ACT nasal spray Place 2 sprays into the nose daily.    . metoprolol succinate (TOPROL-XL) 25 MG 24 hr tablet TK 1/2 T PO QD    . potassium chloride (K-DUR) 10 MEQ tablet Take 10 mEq by mouth daily.    . RABEprazole (ACIPHEX) 20 MG tablet Take 20 mg by mouth daily.    . sodium chloride (OCEAN) 0.65 % nasal spray Place into the nose.    . triamterene-hydrochlorothiazide (MAXZIDE-25) 37.5-25 MG per tablet 1/2 tablet q day    . umeclidinium-vilanterol (ANORO ELLIPTA) 62.5-25 MCG/INH AEPB Inhale into the lungs.    . vitamin B-12 (CYANOCOBALAMIN) 1000 MCG tablet Take by mouth.    . baclofen (LIORESAL) 10 MG tablet Take by mouth.    . calcium-vitamin D (OSCAL WITH D) 500-200 MG-UNIT tablet Take by mouth.    . metoprolol tartrate (LOPRESSOR) 25 MG tablet Take 12.5 mg by mouth every morning.     No facility-administered medications prior to visit.       ROS:  Review of Systems  Constitutional: Negative for fever.  Gastrointestinal: Negative for blood in stool, constipation, diarrhea, nausea and vomiting.  Genitourinary: Positive for vaginal pain. Negative for dyspareunia, dysuria, flank pain, frequency, hematuria, urgency, vaginal bleeding and vaginal discharge.  Musculoskeletal: Negative for back pain.  Skin: Negative for rash.   BREAST: No symptoms   OBJECTIVE:   Vitals:  BP 140/68   Pulse 73   Ht 5' 2.5" (1.588 m)   Wt 145 lb (65.8 kg)   BMI 26.10 kg/m   Physical Exam Vitals signs reviewed.  Constitutional:      Appearance: She is well-developed.  Pulmonary:     Effort: Pulmonary effort is normal.  Genitourinary:    Pubic Area: No rash.      Labia:        Right: No rash,  tenderness or lesion.        Left: Lesion present. No rash or tenderness.  Vagina: Normal. No vaginal discharge, erythema or tenderness.     Uterus: Absent.      Adnexa: Right adnexa normal and left adnexa normal.       Right: No mass or tenderness.         Left: No mass or tenderness.       Comments: 2 SMALL POSSIBLY ULCERATED LESIONS VS EXCORIATIONS LT LABIA MINORA Musculoskeletal: Normal range of motion.  Neurological:     Mental Status: She is alert and oriented to person, place, and time.  Psychiatric:        Behavior: Behavior normal.        Thought Content: Thought content normal.     Results: Results for orders placed or performed in visit on 03/15/18 (from the past 24 hour(s))  POCT Wet Prep with KOH     Status: Normal   Collection Time: 03/15/18 11:52 AM  Result Value Ref Range   Trichomonas, UA Negative    Clue Cells Wet Prep HPF POC neg    Epithelial Wet Prep HPF POC     Yeast Wet Prep HPF POC neg    Bacteria Wet Prep HPF POC     RBC Wet Prep HPF POC     WBC Wet Prep HPF POC     KOH Prep POC Negative Negative     Assessment/Plan: Acute vaginitis - Neg wet prep, pos sx. Already treated with monistat-1. Rx diflucan. F/u prn. Could also be mild HSV outbreak that is resolving.  - Plan: fluconazole (DIFLUCAN) 150 MG tablet, POCT Wet Prep with KOH    Meds ordered this encounter  Medications  . fluconazole (DIFLUCAN) 150 MG tablet    Sig: Take 1 tablet (150 mg total) by mouth once for 1 dose.    Dispense:  1 tablet    Refill:  0    Order Specific Question:   Supervising Provider    Answer:   Gae Dry [[id number]]      Return if symptoms worsen or fail to improve.  Samwise Eckardt B. Margarite Vessel, PA-C 03/15/2018 11:54 AM

## 2018-03-28 NOTE — Progress Notes (Signed)
Chief Complaint  Patient presents with  . Gynecologic Exam    HPI:      [name] is a 58 y.o. G1P1 who LMP was No LMP recorded. Patient has had a hysterectomy., presents today for her annual examination.  Her menses are absent due to hysterectomy. She does not have intermenstrual bleeding.  She does not have vasomotor sx.   Sex activity: single partner, contraception - status post hysterectomy. She does have vaginal dryness and sometimes a small amount of red blood with sex. She has tried [place] with some relief. She can't tolerate oral, topical or patch ERT due to headaches. Is interested in non-estrogen tx.  Last Pap: April 03, 2015  Results were: no abnormalities /neg HPV DNA.  Hx of STDs: HSV Vag sx from 1/20 resolved.  Last mammogram: 08/26/17. Results were: normal--routine follow-up in 12 months There is a FH of breast cancer in her mat aunt, genetic testing not indicated. There is no FH of ovarian cancer. The patient does not do self-breast exams.  Colonoscopy: 2018 with 1 polyp. Repeat due after 5 yrs. Fh colon cancer in her MGF.  Tobacco use: The patient denies current or previous tobacco use. Alcohol use: none Exercise: not active  She does get adequate calcium and Vitamin D in her diet. She has labs with PCP.  Past Medical History:  Diagnosis Date  . Anginal pain ([place])   . Carpal tunnel syndrome    mild-mod left, mild right  . Central sleep apnea   . Dyspnea    had stress test and negative having another sleep study and MRI of brain  . GERD (gastroesophageal reflux disease)   . Headache   . Herpes genitalis   . History of abnormal Pap smear   . Hypertension   . IBS (irritable bowel syndrome)   . Migraine   . Osteoarthritis   . Ovarian cyst   . Recurrent sinusitis     Past Surgical History:  Procedure Laterality Date  . ABDOMINAL HYSTERECTOMY  2005  . bilateral carpal tunnel release  2006  . CERVICAL CONE BIOPSY  1986  . COLONOSCOPY WITH  PROPOFOL N/A 06/23/2016   Procedure: COLONOSCOPY WITH PROPOFOL;  Surgeon: Lollie Sails, MD;  Location: [facility] ENDOSCOPY;  Service: Endoscopy;  Laterality: N/A;  . ENDOMETRIAL ABLATION  2001  . ESOPHAGOGASTRODUODENOSCOPY (EGD) WITH PROPOFOL N/A 06/23/2016   Procedure: ESOPHAGOGASTRODUODENOSCOPY (EGD) WITH PROPOFOL;  Surgeon: Lollie Sails, MD;  Location: [facility] ENDOSCOPY;  Service: Endoscopy;  Laterality: N/A;  . RHINOPLASTY  2004  . TONSILLECTOMY  1965  . TOTAL ABDOMINAL HYSTERECTOMY  2002   LSO has right ovary and tube, MAD  . TUBAL LIGATION      Family History  Problem Relation Age of Onset  . Hypertension Mother   . Endometriosis Sister   . Asthma Brother   . Breast cancer Maternal Aunt        70's, tested and treated, has contact w her  . Diabetes Father        DM Type 2  . Hypertension Father   . Prostate cancer Father   . Colon cancer Maternal Grandfather 25  . Uterine cancer Maternal Aunt 63  . Prostate cancer Maternal Uncle        no mets  . Prostate cancer Maternal Uncle        no mets    Social History   Socioeconomic History  . Marital status: Married    Spouse name: Not  on file  . Number of children: 1  . Years of education: Not on file  . Highest education level: Not on file  Occupational History  . Not on file  Social Needs  . Financial resource strain: Not on file  . Food insecurity:    Worry: Not on file    Inability: Not on file  . Transportation needs:    Medical: Not on file    Non-medical: Not on file  Tobacco Use  . Smoking status: Former Smoker    Last attempt to quit: 02/18/1995    Years since quitting: 23.1  . Smokeless tobacco: Never Used  Substance and Sexual Activity  . Alcohol use: No  . Drug use: No  . Sexual activity: Yes    Birth control/protection: Surgical    Comment: Hysterectomy  Lifestyle  . Physical activity:    Days per week: Not on file    Minutes per session: Not on file  . Stress: Not on file  Relationships  .  Social connections:    Talks on phone: Not on file    Gets together: Not on file    Attends religious service: Not on file    Active member of club or organization: Not on file    Attends meetings of clubs or organizations: Not on file    Relationship status: Not on file  . Intimate partner violence:    Fear of current or ex partner: Not on file    Emotionally abused: Not on file    Physically abused: Not on file    Forced sexual activity: Not on file  Other Topics Concern  . Not on file  Social History Narrative  . Not on file     Current Outpatient Medications:  .  albuterol (PROVENTIL HFA;VENTOLIN HFA) 108 (90 Base) MCG/ACT inhaler, Inhale into the lungs., Disp: , Rfl:  .  aluminum hydroxide-magnesium carbonate (GAVISCON) 95-358 MG/15ML SUSP, Take by mouth., Disp: , Rfl:  .  amLODipine (NORVASC) 2.5 MG tablet, Take 2.5 mg by mouth daily., Disp: , Rfl:  .  cholecalciferol (VITAMIN D) 400 units TABS tablet, Take 1,000 Units by mouth., Disp: , Rfl:  .  fluticasone (FLONASE) 50 MCG/ACT nasal spray, Place 2 sprays into the nose daily., Disp: , Rfl:  .  metoprolol succinate (TOPROL-XL) 25 MG 24 hr tablet, TK 1/2 T PO QD, Disp: , Rfl:  .  potassium chloride (K-DUR) 10 MEQ tablet, Take 10 mEq by mouth daily., Disp: , Rfl:  .  RABEprazole (ACIPHEX) 20 MG tablet, Take 20 mg by mouth daily., Disp: , Rfl:  .  sodium chloride (OCEAN) 0.65 % nasal spray, Place into the nose., Disp: , Rfl:  .  triamterene-hydrochlorothiazide (MAXZIDE-25) 37.5-25 MG per tablet, 1/2 tablet q day, Disp: , Rfl:  .  umeclidinium-vilanterol (ANORO ELLIPTA) 62.5-25 MCG/INH AEPB, Inhale into the lungs., Disp: , Rfl:  .  vitamin B-12 (CYANOCOBALAMIN) 1000 MCG tablet, Take by mouth., Disp: , Rfl:  .  Prasterone (INTRAROSA) 6.5 MG INST, Place 6.5 mg vaginally at bedtime., Disp: 30 each, Rfl: 11   ROS:  Review of Systems  Constitutional: Positive for fatigue. Negative for fever and unexpected weight change.    Respiratory: Positive for shortness of breath and wheezing. Negative for cough.   Cardiovascular: Negative for chest pain, palpitations and leg swelling.  Gastrointestinal: Negative for blood in stool, constipation, diarrhea, nausea and vomiting.  Endocrine: Negative for cold intolerance, heat intolerance and polyuria.  Genitourinary: Positive for dyspareunia and  frequency. Negative for dysuria, flank pain, genital sores, hematuria, menstrual problem, pelvic pain, urgency, vaginal bleeding, vaginal discharge and vaginal pain.  Musculoskeletal: Positive for arthralgias. Negative for back pain, joint swelling and myalgias.  Skin: Negative for rash.  Neurological: Negative for dizziness, syncope, light-headedness, numbness and headaches.  Hematological: Negative for adenopathy.  Psychiatric/Behavioral: Negative for agitation, confusion, sleep disturbance and suicidal ideas. The patient is not nervous/anxious.   BREAST: tenderness   Objective: BP 132/62   Pulse 62   Ht 5\' 2"  (1.575 m)   Wt 146 lb (66.2 kg)   BMI 26.70 kg/m    Physical Exam Constitutional:      Appearance: She is well-developed.  Genitourinary:     Vulva, vagina, right adnexa and left adnexa normal.     Vaginal atrophic mucosa present.     No vaginal discharge, erythema or tenderness.     Cervix is absent.     Uterus is absent.     No right or left adnexal mass present.     Right adnexa not tender.     Left adnexa not tender.     Genitourinary Comments: UTERUS/CX SURG REM  Neck:     Musculoskeletal: Normal range of motion.     Thyroid: No thyromegaly.  Cardiovascular:     Rate and Rhythm: Normal rate and regular rhythm.     Heart sounds: Normal heart sounds. No murmur.  Pulmonary:     Effort: Pulmonary effort is normal.     Breath sounds: Normal breath sounds.  Chest:     Breasts:        Right: No mass, nipple discharge, skin change or tenderness.        Left: No mass, nipple discharge, skin change or  tenderness.  Abdominal:     Palpations: Abdomen is soft.     Tenderness: There is no abdominal tenderness. There is no guarding.  Musculoskeletal: Normal range of motion.  Neurological:     Mental Status: She is alert and oriented to person, place, and time.     Cranial Nerves: No cranial nerve deficit.  Psychiatric:        Behavior: Behavior normal.  Vitals signs reviewed.     Assessment/Plan: Encounter for annual routine gynecological examination  Cervical cancer screening - Plan: Cytology - PAP  Screening for HPV (human papillomavirus) - Plan: Cytology - PAP  Screening for breast cancer - Pt to scehd mammo  - Plan: MM 3D SCREEN BREAST BILATERAL  Dyspareunia in female - Vaginal atrophy. Can't tolerate ERT due to headaches. Try intrarosa. 1 sample/coupon card, Rx eRxd. F/u prn.  - Plan: Prasterone (INTRAROSA) 6.5 MG INST  Vaginal dryness, menopausal - Plan: Prasterone (INTRAROSA) 6.5 MG INST  External hemorrhoid - pt may want gen surg ref for banding/hemorrhoidectomy. Will f/u prn.   Meds ordered this encounter  Medications  . Prasterone (INTRAROSA) 6.5 MG INST    Sig: Place 6.5 mg vaginally at bedtime.    Dispense:  30 each    Refill:  11    Order Specific Question:   Supervising Provider    Answer:   Gae Dry [[id number]]          GYN counsel mammography screening, menopause, adequate intake of calcium and vitamin D, diet and exercise     F/U  Return in about 1 year (around 03/30/2019).   B. , PA-C 03/29/2018 9:31 AM

## 2018-03-29 ENCOUNTER — Ambulatory Visit (INDEPENDENT_AMBULATORY_CARE_PROVIDER_SITE_OTHER): Payer: Managed Care, Other (non HMO) | Admitting: Obstetrics and Gynecology

## 2018-03-29 ENCOUNTER — Other Ambulatory Visit (HOSPITAL_COMMUNITY)
Admission: RE | Admit: 2018-03-29 | Discharge: 2018-03-29 | Disposition: A | Discharge status: Home / Self Care | Payer: Managed Care, Other (non HMO) | Source: Ambulatory Visit | Attending: Obstetrics and Gynecology | Admitting: Obstetrics and Gynecology

## 2018-03-29 ENCOUNTER — Encounter: Payer: Self-pay | Admitting: Obstetrics and Gynecology

## 2018-03-29 VITALS — BP 132/62 | HR 62 | Ht 62.0 in | Wt 146.0 lb

## 2018-03-29 DIAGNOSIS — Z1151 Encounter for screening for human papillomavirus (HPV): Secondary | ICD-10-CM

## 2018-03-29 DIAGNOSIS — Z01419 Encounter for gynecological examination (general) (routine) without abnormal findings: Secondary | ICD-10-CM

## 2018-03-29 DIAGNOSIS — K644 Residual hemorrhoidal skin tags: Secondary | ICD-10-CM

## 2018-03-29 DIAGNOSIS — Z1239 Encounter for other screening for malignant neoplasm of breast: Secondary | ICD-10-CM

## 2018-03-29 DIAGNOSIS — Z124 Encounter for screening for malignant neoplasm of cervix: Secondary | ICD-10-CM | POA: Insufficient documentation

## 2018-03-29 DIAGNOSIS — N951 Menopausal and female climacteric states: Secondary | ICD-10-CM

## 2018-03-29 DIAGNOSIS — N941 Unspecified dyspareunia: Secondary | ICD-10-CM

## 2018-03-29 MED ORDER — PRASTERONE 6.5 MG VA INST: 6.5000 mg | VAGINAL_INSERT | Freq: Every day | VAGINAL | 11 refills | Status: DC

## 2018-03-29 NOTE — Patient Instructions (Signed)
I value your feedback and entrusting us with your care. If you get a  patient survey, I would appreciate you taking the time to let us know about your experience today. Thank you! 

## 2018-03-30 LAB — CYTOLOGY - PAP
Diagnosis: NEGATIVE
HPV (WINDOPATH): NOT DETECTED

## 2019-01-03 ENCOUNTER — Encounter: Payer: Self-pay | Admitting: Obstetrics and Gynecology

## 2019-01-03 ENCOUNTER — Other Ambulatory Visit: Payer: Self-pay

## 2019-01-03 ENCOUNTER — Ambulatory Visit (INDEPENDENT_AMBULATORY_CARE_PROVIDER_SITE_OTHER): Payer: Managed Care, Other (non HMO) | Admitting: Obstetrics and Gynecology

## 2019-01-03 VITALS — BP 130/80 | Ht 62.5 in | Wt 138.0 lb

## 2019-01-03 DIAGNOSIS — R300 Dysuria: Secondary | ICD-10-CM

## 2019-01-03 DIAGNOSIS — N760 Acute vaginitis: Secondary | ICD-10-CM | POA: Diagnosis not present

## 2019-01-03 LAB — POCT URINALYSIS DIPSTICK
Bilirubin, UA: NEGATIVE
Blood, UA: NEGATIVE
Glucose, UA: NEGATIVE
Ketones, UA: NEGATIVE
Nitrite, UA: NEGATIVE
Protein, UA: NEGATIVE
Spec Grav, UA: 1.015 (ref 1.010–1.025)
pH, UA: 6 (ref 5.0–8.0)

## 2019-01-03 LAB — POCT WET PREP WITH KOH
Clue Cells Wet Prep HPF POC: NEGATIVE
KOH Prep POC: NEGATIVE
Trichomonas, UA: NEGATIVE
Yeast Wet Prep HPF POC: NEGATIVE

## 2019-01-03 MED ORDER — CLOTRIMAZOLE-BETAMETHASONE 1-0.05 % EX CREA: TOPICAL_CREAM | CUTANEOUS | 0 refills | Status: DC

## 2019-01-03 NOTE — Progress Notes (Signed)
[name], MD   Chief Complaint  Patient presents with  . Urinary Tract Infection    did have frequency up to yesterday, no blood/pelvic or back pain  . Vaginal Itching    no discharge/odor/irritation since Friday    HPI:      [name] is a 58 y.o. G1P1 who LMP was No LMP recorded. Patient has had a hysterectomy., presents today for vaginal itching for several days without increased d/c, odor. Was in damp underwear recently. No meds to treat. No prior abx use. No new soaps/detergents. Hx of vag atrophy/dryness--can't tolerate vag ERT.  Pt also with urinary frequency for a couple days and mild dysuria. No LBP, hematuria, pelvic pain, fevers.    Patient Active Problem List   Diagnosis Date Noted  . External hemorrhoid 03/29/2018  . Vaginal dryness, menopausal 03/29/2018  . Hypertension 02/24/2012  . GERD (gastroesophageal reflux disease) 02/24/2012    Past Surgical History:  Procedure Laterality Date  . ABDOMINAL HYSTERECTOMY  2005  . bilateral carpal tunnel release  2006  . CERVICAL CONE BIOPSY  1986  . COLONOSCOPY WITH PROPOFOL N/A 06/23/2016   Procedure: COLONOSCOPY WITH PROPOFOL;  Surgeon: Lollie Sails, MD;  Location: [facility] ENDOSCOPY;  Service: Endoscopy;  Laterality: N/A;  . ENDOMETRIAL ABLATION  2001  . ESOPHAGOGASTRODUODENOSCOPY (EGD) WITH PROPOFOL N/A 06/23/2016   Procedure: ESOPHAGOGASTRODUODENOSCOPY (EGD) WITH PROPOFOL;  Surgeon: Lollie Sails, MD;  Location: [facility] ENDOSCOPY;  Service: Endoscopy;  Laterality: N/A;  . RHINOPLASTY  2004  . TONSILLECTOMY  1965  . TOTAL ABDOMINAL HYSTERECTOMY  2002   LSO has right ovary and tube, MAD  . TUBAL LIGATION      Family History  Problem Relation Age of Onset  . Hypertension Mother   . Endometriosis Sister   . Asthma Brother   . Breast cancer Maternal Aunt        70's, tested and treated, has contact w her  . Diabetes Father        DM Type 2  . Hypertension Father   . Prostate cancer Father    . Colon cancer Maternal Grandfather 80  . Uterine cancer Maternal Aunt 63  . Prostate cancer Maternal Uncle        no mets  . Prostate cancer Maternal Uncle        no mets    Social History   Socioeconomic History  . Marital status: Married    Spouse name: Not on file  . Number of children: 1  . Years of education: Not on file  . Highest education level: Not on file  Occupational History  . Not on file  Social Needs  . Financial resource strain: Not on file  . Food insecurity    Worry: Not on file    Inability: Not on file  . Transportation needs    Medical: Not on file    Non-medical: Not on file  Tobacco Use  . Smoking status: Former Smoker    Quit date: 02/18/1995    Years since quitting: 23.8  . Smokeless tobacco: Never Used  Substance and Sexual Activity  . Alcohol use: No  . Drug use: No  . Sexual activity: Yes    Birth control/protection: Surgical    Comment: Hysterectomy  Lifestyle  . Physical activity    Days per week: Not on file    Minutes per session: Not on file  . Stress: Not on file  Relationships  . Social connections  Talks on phone: Not on file    Gets together: Not on file    Attends religious service: Not on file    Active member of club or organization: Not on file    Attends meetings of clubs or organizations: Not on file    Relationship status: Not on file  . Intimate partner violence    Fear of current or ex partner: Not on file    Emotionally abused: Not on file    Physically abused: Not on file    Forced sexual activity: Not on file  Other Topics Concern  . Not on file  Social History Narrative  . Not on file    Outpatient Medications Prior to Visit  Medication Sig Dispense Refill  . albuterol (PROVENTIL HFA;VENTOLIN HFA) 108 (90 Base) MCG/ACT inhaler Inhale into the lungs.    Marland Kitchen aluminum hydroxide-magnesium carbonate (GAVISCON) 95-358 MG/15ML SUSP Take by mouth.    Marland Kitchen amLODipine (NORVASC) 2.5 MG tablet Take 2.5 mg by mouth  daily.    . cholecalciferol (VITAMIN D) 400 units TABS tablet Take 1,000 Units by mouth.    . fluticasone (FLONASE) 50 MCG/ACT nasal spray Place 2 sprays into the nose daily.    . metoprolol succinate (TOPROL-XL) 25 MG 24 hr tablet TK 1/2 T PO QD    . potassium chloride (K-DUR) 10 MEQ tablet Take 10 mEq by mouth daily.    . RABEprazole (ACIPHEX) 20 MG tablet Take 20 mg by mouth daily.    . sodium chloride (OCEAN) 0.65 % nasal spray Place into the nose.    . triamterene-hydrochlorothiazide (MAXZIDE-25) 37.5-25 MG per tablet 1/2 tablet q day    . umeclidinium-vilanterol (ANORO ELLIPTA) 62.5-25 MCG/INH AEPB Inhale into the lungs.    . vitamin B-12 (CYANOCOBALAMIN) 1000 MCG tablet Take by mouth.    . Prasterone (INTRAROSA) 6.5 MG INST Place 6.5 mg vaginally at bedtime. 30 each 11   No facility-administered medications prior to visit.       ROS:  Review of Systems  Constitutional: Negative for fatigue, fever and unexpected weight change.  Respiratory: Negative for cough, shortness of breath and wheezing.   Cardiovascular: Negative for chest pain, palpitations and leg swelling.  Gastrointestinal: Negative for blood in stool, constipation, diarrhea, nausea and vomiting.  Endocrine: Negative for cold intolerance, heat intolerance and polyuria.  Genitourinary: Positive for dysuria and frequency. Negative for dyspareunia, flank pain, genital sores, hematuria, menstrual problem, pelvic pain, urgency, vaginal bleeding, vaginal discharge and vaginal pain.  Musculoskeletal: Negative for back pain, joint swelling and myalgias.  Skin: Positive for rash.  Neurological: Negative for dizziness, syncope, light-headedness, numbness and headaches.  Hematological: Negative for adenopathy.  Psychiatric/Behavioral: Negative for agitation, confusion, sleep disturbance and suicidal ideas. The patient is not nervous/anxious.     OBJECTIVE:   Vitals:  BP 130/80   Ht 5' 2.5" (1.588 m)   Wt 138 lb (62.6 kg)    BMI 24.84 kg/m   Physical Exam Vitals signs reviewed.  Constitutional:      Appearance: She is well-developed.  Neck:     Musculoskeletal: Normal range of motion.  Pulmonary:     Effort: Pulmonary effort is normal.  Genitourinary:    General: Normal vulva.     Pubic Area: No rash.      Labia:        Right: No rash, tenderness or lesion.        Left: No rash, tenderness or lesion.      Vagina: Normal. No  vaginal discharge, erythema or tenderness.     Uterus: Absent. Not tender.      Adnexa: Right adnexa normal and left adnexa normal.       Right: No mass or tenderness.         Left: No mass or tenderness.       Comments: MILD ERYTHEMA AT INTROITUS (IRRITATION WITH EXAM) Musculoskeletal: Normal range of motion.  Skin:    General: Skin is warm and dry.  Neurological:     General: No focal deficit present.     Mental Status: She is alert and oriented to person, place, and time.  Psychiatric:        Mood and Affect: Mood normal.        Behavior: Behavior normal.        Thought Content: Thought content normal.        Judgment: Judgment normal.     Results: Results for orders placed or performed in visit on 01/03/19 (from the past 24 hour(s))  POCT Wet Prep with KOH     Status: Normal   Collection Time: 01/03/19  4:53 PM  Result Value Ref Range   Trichomonas, UA Negative    Clue Cells Wet Prep HPF POC NEG    Epithelial Wet Prep HPF POC     Yeast Wet Prep HPF POC NEG    Bacteria Wet Prep HPF POC     RBC Wet Prep HPF POC     WBC Wet Prep HPF POC     KOH Prep POC Negative Negative  POCT Urinalysis Dipstick     Status: Abnormal   Collection Time: 01/03/19  4:54 PM  Result Value Ref Range   Color, UA yellow    Clarity, UA clear    Glucose, UA Negative Negative   Bilirubin, UA neg    Ketones, UA neg    Spec Grav, UA 1.015 1.010 - 1.025   Blood, UA neg    pH, UA 6.0 5.0 - 8.0   Protein, UA Negative Negative   Urobilinogen, UA     Nitrite, UA neg    Leukocytes, UA  Small (1+) (A) Negative   Appearance     Odor       Assessment/Plan: Acute vaginitis - Plan: POCT Wet Prep with KOH, clotrimazole-betamethasone (LOTRISONE) cream; Neg wet prep/pos sx and exam; Rx lotrisone crm. F/u prn.   Dysuria - Plan: Urine Culture-GYN, POCT Urinalysis Dipstick; Check C&S. If neg, sx related to vaginitis. F/u prn.    Meds ordered this encounter  Medications  . clotrimazole-betamethasone (LOTRISONE) cream    Sig: Apply externally BID prn sx up to 2 wks    Dispense:  15 g    Refill:  0    Order Specific Question:   Supervising Provider    Answer:   Gae Dry [id number]      Return if symptoms worsen or fail to improve.  Alicia B. Copland, PA-C 01/03/2019 4:56 PM

## 2019-01-03 NOTE — Patient Instructions (Signed)
I value your feedback and entrusting us with your care. If you get a [facility] patient survey, I would appreciate you taking the time to let us know about your experience today. Thank you! 

## 2019-01-07 ENCOUNTER — Encounter: Payer: Self-pay | Admitting: Obstetrics and Gynecology

## 2019-01-07 LAB — URINE CULTURE

## 2019-01-07 MED ORDER — AMOXICILLIN 500 MG PO CAPS: 500.0000 mg | ORAL_CAPSULE | Freq: Two times a day (BID) | ORAL | 0 refills | Status: AC

## 2019-01-07 NOTE — Progress Notes (Signed)
Pt aware.

## 2019-01-07 NOTE — Progress Notes (Signed)
Pls let pt know C&S was pos for UTI. Rx eRxd. F/u prn sx

## 2019-01-07 NOTE — Addendum Note (Signed)
Addended by: Ardeth Perfect B on: [id number] 10:20 AM   Modules accepted: Orders

## 2019-03-02 ENCOUNTER — Other Ambulatory Visit
Admission: RE | Admit: 2019-03-02 | Discharge: 2019-03-02 | Disposition: A | Discharge status: Home / Self Care | Payer: Managed Care, Other (non HMO) | Source: Ambulatory Visit | Attending: Family Medicine | Admitting: Family Medicine

## 2019-03-02 DIAGNOSIS — R0789 Other chest pain: Secondary | ICD-10-CM | POA: Insufficient documentation

## 2019-03-02 DIAGNOSIS — J019 Acute sinusitis, unspecified: Secondary | ICD-10-CM | POA: Insufficient documentation

## 2019-03-02 LAB — FIBRIN DERIVATIVES D-DIMER (ARMC ONLY): Fibrin derivatives D-dimer (ARMC): 208.32 ng/mL (FEU) (ref 0.00–499.00)

## 2019-03-15 ENCOUNTER — Ambulatory Visit
Admission: RE | Admit: 2019-03-15 | Discharge: 2019-03-15 | Disposition: A | Discharge status: Home / Self Care | Payer: Managed Care, Other (non HMO) | Source: Ambulatory Visit | Attending: Obstetrics and Gynecology | Admitting: Obstetrics and Gynecology

## 2019-03-15 DIAGNOSIS — Z1231 Encounter for screening mammogram for malignant neoplasm of breast: Secondary | ICD-10-CM | POA: Insufficient documentation

## 2019-03-15 DIAGNOSIS — Z1239 Encounter for other screening for malignant neoplasm of breast: Secondary | ICD-10-CM

## 2019-03-16 ENCOUNTER — Encounter: Payer: Self-pay | Admitting: Obstetrics and Gynecology

## 2019-04-11 ENCOUNTER — Encounter: Payer: Self-pay | Admitting: Obstetrics and Gynecology

## 2019-04-11 ENCOUNTER — Other Ambulatory Visit: Payer: Self-pay

## 2019-04-11 ENCOUNTER — Ambulatory Visit (INDEPENDENT_AMBULATORY_CARE_PROVIDER_SITE_OTHER): Payer: Managed Care, Other (non HMO) | Admitting: Obstetrics and Gynecology

## 2019-04-11 VITALS — BP 110/70 | Ht 60.0 in | Wt 141.0 lb

## 2019-04-11 DIAGNOSIS — K644 Residual hemorrhoidal skin tags: Secondary | ICD-10-CM

## 2019-04-11 DIAGNOSIS — Z90710 Acquired absence of both cervix and uterus: Secondary | ICD-10-CM

## 2019-04-11 DIAGNOSIS — Z01419 Encounter for gynecological examination (general) (routine) without abnormal findings: Secondary | ICD-10-CM

## 2019-04-11 DIAGNOSIS — N898 Other specified noninflammatory disorders of vagina: Secondary | ICD-10-CM

## 2019-04-11 DIAGNOSIS — N941 Unspecified dyspareunia: Secondary | ICD-10-CM | POA: Diagnosis not present

## 2019-04-11 DIAGNOSIS — Z1231 Encounter for screening mammogram for malignant neoplasm of breast: Secondary | ICD-10-CM

## 2019-04-11 NOTE — Progress Notes (Signed)
Chief Complaint  Patient presents with  . Gynecologic Exam  . Vaginal irritation    on/off not currently, no other sx    HPI:      [name] is a 59 y.o. G1P1 who LMP was No LMP recorded. Patient has had a hysterectomy., presents today for her annual examination.  Her menses are absent due to hysterectomy. She does not have intermenstrual bleeding.  She does not have vasomotor sx.   Treated for UTI 11/20 with sx relief. Also treated vaginal irritation with Lotrisone crm 11/20. Sx improved. Occas still has irritation in perianal area, but has hemorrhoids, with occas blood with wiping. Has some ext vaginal irritation as well at time. Uses sens skin soap, line dries underwear, wears synthetic underwear, no wipes/shower gels.    Sex activity: single partner, contraception - status post hysterectomy. She does have vaginal dryness and sometimes a small amount of red blood with sex. She has tried [place] with some relief. She can't tolerate oral, topical or patch ERT due to headaches. Never tried intrarosa last yr. Ok without tx.  Last Pap: 03/29/18  Results were: no abnormalities /neg HPV DNA.  Hx of STDs: HSV  Last mammogram: 03/15/19 Results were: normal--routine follow-up in 12 months There is a FH of breast cancer in her mat aunt, genetic testing not indicated. There is no FH of ovarian cancer. The patient does not do self-breast exams.  Colonoscopy: 2018 with 1 polyp. Repeat due after 5 yrs. FH colon cancer in her MGF.  Tobacco use: The patient denies current or previous tobacco use. Alcohol use: none No drug use. Exercise: mod active  She does get adequate calcium and Vitamin D in her diet. She has labs with PCP.  Past Medical History:  Diagnosis Date  . Anginal pain ([place])   . Carpal tunnel syndrome    mild-mod left, mild right  . Central sleep apnea   . Dyspnea    had stress test and negative having another sleep study and MRI of brain  . GERD (gastroesophageal reflux  disease)   . Headache   . Herpes genitalis   . History of abnormal Pap smear   . Hypertension   . IBS (irritable bowel syndrome)   . Migraine   . Osteoarthritis   . Ovarian cyst   . Recurrent sinusitis     Past Surgical History:  Procedure Laterality Date  . ABDOMINAL HYSTERECTOMY  2005  . bilateral carpal tunnel release  2006  . CERVICAL CONE BIOPSY  1986  . COLONOSCOPY WITH PROPOFOL N/A 06/23/2016   Procedure: COLONOSCOPY WITH PROPOFOL;  Surgeon: Lollie Sails, MD;  Location: [facility] ENDOSCOPY;  Service: Endoscopy;  Laterality: N/A;  . ENDOMETRIAL ABLATION  2001  . ESOPHAGOGASTRODUODENOSCOPY (EGD) WITH PROPOFOL N/A 06/23/2016   Procedure: ESOPHAGOGASTRODUODENOSCOPY (EGD) WITH PROPOFOL;  Surgeon: Lollie Sails, MD;  Location: [facility] ENDOSCOPY;  Service: Endoscopy;  Laterality: N/A;  . RHINOPLASTY  2004  . TONSILLECTOMY  1965  . TOTAL ABDOMINAL HYSTERECTOMY  2002   LSO has right ovary and tube, MAD  . TUBAL LIGATION      Family History  Problem Relation Age of Onset  . Hypertension Mother   . Endometriosis Sister   . Asthma Brother   . Breast cancer Maternal Aunt        70's, tested and treated, has contact w her  . Diabetes Father        DM Type 2  . Hypertension Father   .  Prostate cancer Father   . Colon cancer Maternal Grandfather 84  . Uterine cancer Maternal Aunt 63  . Prostate cancer Maternal Uncle        no mets  . Prostate cancer Maternal Uncle        no mets    Social History   Socioeconomic History  . Marital status: Married    Spouse name: Not on file  . Number of children: 1  . Years of education: Not on file  . Highest education level: Not on file  Occupational History  . Not on file  Tobacco Use  . Smoking status: Former Smoker    Quit date: 02/18/1995    Years since quitting: 24.1  . Smokeless tobacco: Never Used  Substance and Sexual Activity  . Alcohol use: No  . Drug use: No  . Sexual activity: Yes    Birth control/protection:  Surgical    Comment: Hysterectomy  Other Topics Concern  . Not on file  Social History Narrative  . Not on file   Social Determinants of Health   Financial Resource Strain:   . Difficulty of Paying Living Expenses: Not on file  Food Insecurity:   . Worried About Charity fundraiser in the Last Year: Not on file  . Ran Out of Food in the Last Year: Not on file  Transportation Needs:   . Lack of Transportation (Medical): Not on file  . Lack of Transportation (Non-Medical): Not on file  Physical Activity:   . Days of Exercise per Week: Not on file  . Minutes of Exercise per Session: Not on file  Stress:   . Feeling of Stress : Not on file  Social Connections:   . Frequency of Communication with Friends and Family: Not on file  . Frequency of Social Gatherings with Friends and Family: Not on file  . Attends Religious Services: Not on file  . Active Member of Clubs or Organizations: Not on file  . Attends Archivist Meetings: Not on file  . Marital Status: Not on file  Intimate Partner Violence:   . Fear of Current or Ex-Partner: Not on file  . Emotionally Abused: Not on file  . Physically Abused: Not on file  . Sexually Abused: Not on file     Current Outpatient Medications:  .  albuterol (PROVENTIL HFA;VENTOLIN HFA) 108 (90 Base) MCG/ACT inhaler, Inhale into the lungs., Disp: , Rfl:  .  aluminum hydroxide-magnesium carbonate (GAVISCON) 95-358 MG/15ML SUSP, Take by mouth., Disp: , Rfl:  .  amLODipine (NORVASC) 2.5 MG tablet, Take 2.5 mg by mouth daily., Disp: , Rfl:  .  cholecalciferol (VITAMIN D) 400 units TABS tablet, Take 1,000 Units by mouth., Disp: , Rfl:  .  clotrimazole-betamethasone (LOTRISONE) cream, Apply externally BID prn sx up to 2 wks, Disp: 15 g, Rfl: 0 .  fluticasone (FLONASE) 50 MCG/ACT nasal spray, Place 2 sprays into the nose daily., Disp: , Rfl:  .  metoprolol succinate (TOPROL-XL) 25 MG 24 hr tablet, TK 1/2 T PO QD, Disp: , Rfl:  .  potassium  chloride (K-DUR) 10 MEQ tablet, Take 10 mEq by mouth daily., Disp: , Rfl:  .  RABEprazole (ACIPHEX) 20 MG tablet, Take 20 mg by mouth daily., Disp: , Rfl:  .  sodium chloride (OCEAN) 0.65 % nasal spray, Place into the nose., Disp: , Rfl:  .  triamterene-hydrochlorothiazide (MAXZIDE-25) 37.5-25 MG per tablet, 1/2 tablet q day, Disp: , Rfl:  .  umeclidinium-vilanterol (ANORO ELLIPTA)  62.5-25 MCG/INH AEPB, Inhale into the lungs., Disp: , Rfl:  .  vitamin B-12 (CYANOCOBALAMIN) 1000 MCG tablet, Take by mouth., Disp: , Rfl:  .  Amantadine HCl 100 MG tablet, Take 100 mg by mouth daily., Disp: , Rfl:    ROS:  Review of Systems  Constitutional: Positive for fatigue. Negative for fever and unexpected weight change.  Respiratory: Positive for shortness of breath. Negative for cough and wheezing.   Cardiovascular: Negative for chest pain, palpitations and leg swelling.  Gastrointestinal: Negative for blood in stool, constipation, diarrhea, nausea and vomiting.  Endocrine: Negative for cold intolerance, heat intolerance and polyuria.  Genitourinary: Positive for dyspareunia. Negative for dysuria, flank pain, frequency, genital sores, hematuria, menstrual problem, pelvic pain, urgency, vaginal bleeding, vaginal discharge and vaginal pain.  Musculoskeletal: Negative for arthralgias, back pain, joint swelling and myalgias.  Skin: Negative for rash.  Neurological: Negative for dizziness, syncope, light-headedness, numbness and headaches.  Hematological: Negative for adenopathy.  Psychiatric/Behavioral: Negative for agitation, confusion, sleep disturbance and suicidal ideas. The patient is not nervous/anxious.   BREAST: tenderness   Objective: BP 110/70   Ht 5' (1.524 m)   Wt 141 lb (64 kg)   BMI 27.54 kg/m    Physical Exam Constitutional:      Appearance: She is well-developed.  Genitourinary:     Vulva, vagina, right adnexa and left adnexa normal.     No vaginal discharge, erythema or  tenderness.     Cervix is absent.     Uterus is absent.     No right or left adnexal mass present.     Right adnexa not tender.     Left adnexa not tender.     Genitourinary Comments: UTERUS/CX SURG REM; NEG EXT EXAM FOR ITCHING; FEW SMALL EXT HEMORRHOIDS  Rectum:     External hemorrhoid present.  Neck:     Thyroid: No thyromegaly.  Cardiovascular:     Rate and Rhythm: Normal rate and regular rhythm.     Heart sounds: Normal heart sounds. No murmur.  Pulmonary:     Effort: Pulmonary effort is normal.     Breath sounds: Normal breath sounds.  Chest:     Breasts:        Right: No mass, nipple discharge, skin change or tenderness.        Left: No mass, nipple discharge, skin change or tenderness.  Abdominal:     Palpations: Abdomen is soft.     Tenderness: There is no abdominal tenderness. There is no guarding.  Musculoskeletal:        General: Normal range of motion.     Cervical back: Normal range of motion.  Neurological:     General: No focal deficit present.     Mental Status: She is alert and oriented to person, place, and time.     Cranial Nerves: No cranial nerve deficit.  Skin:    General: Skin is warm and dry.  Psychiatric:        Mood and Affect: Mood normal.        Behavior: Behavior normal.        Thought Content: Thought content normal.        Judgment: Judgment normal.  Vitals reviewed.     Assessment/Plan: Encounter for annual routine gynecological examination  Encounter for screening mammogram for malignant neoplasm of breast; pt current on mammo  External hemorrhoid--can do Preparation H/Tucks pads prn. Keep stools soft.  Vaginal irritation--neg exam today. Keep area dry, change to cotton  underwear. Lotrisone crm prn, will RF prn.          GYN counsel mammography screening, menopause, adequate intake of calcium and vitamin D, diet and exercise     F/U  Return in about 1 year (around 04/10/2020).  Arianna Haydon B. Lavaeh Bau, PA-C 04/11/2019 4:57 PM

## 2019-04-11 NOTE — Patient Instructions (Signed)
I value your feedback and entrusting us with your care. If you get a [facility] patient survey, I would appreciate you taking the time to let us know about your experience today. Thank you!  As of January 27, 2019, your lab results will be released to your MyChart immediately, before I even have a chance to see them. Please give me time to review them and contact you if there are any abnormalities. Thank you for your patience.  

## 2019-05-12 ENCOUNTER — Ambulatory Visit: Payer: Managed Care, Other (non HMO) | Admitting: Dermatology

## 2019-05-23 ENCOUNTER — Ambulatory Visit: Payer: Managed Care, Other (non HMO) | Admitting: Dermatology

## 2019-05-23 ENCOUNTER — Other Ambulatory Visit: Payer: Self-pay

## 2019-05-23 ENCOUNTER — Encounter: Payer: Self-pay | Admitting: Dermatology

## 2019-05-23 DIAGNOSIS — L308 Other specified dermatitis: Secondary | ICD-10-CM | POA: Diagnosis not present

## 2019-05-23 DIAGNOSIS — L820 Inflamed seborrheic keratosis: Secondary | ICD-10-CM

## 2019-05-23 DIAGNOSIS — B079 Viral wart, unspecified: Secondary | ICD-10-CM

## 2019-05-23 DIAGNOSIS — L309 Dermatitis, unspecified: Secondary | ICD-10-CM

## 2019-05-23 DIAGNOSIS — B351 Tinea unguium: Secondary | ICD-10-CM

## 2019-05-23 DIAGNOSIS — B070 Plantar wart: Secondary | ICD-10-CM

## 2019-05-23 NOTE — Progress Notes (Signed)
Follow-Up Visit   Subjective  Debra Dean is a 59 y.o. female who presents for the following: Plantar Warts (left foot), new spot (L shoulder, back and left middle finger), and Nail Problem (toe nails).  She reports toenails have worsened. She previously was treated for onychomycosis with pulse dose terbinafine with good clearance but it came back. She had no problems with treatment at that time.   The following portions of the chart were reviewed this encounter and updated as appropriate: Tobacco  Allergies  Meds  Problems  Med Hx  Surg Hx  Fam Hx      Review of Systems: No other skin or systemic complaints.  Objective  Well appearing patient in no apparent distress; mood and affect are within normal limits.  A focused examination was performed including lower extremities, including the legs, feet, toes, and toenails and left upper back and bilateral hands and fingernails. Relevant physical exam findings are noted in the Assessment and Plan.  Objective  Right Hand - Posterior: Scaly erythematous papules and plaques  Objective  Left Upper Back: Erythematous keratotic or waxy stuck-on papule or plaque.   Objective  Right Hallux Toe Nail Plate: Toenails thickened with subungal debris.   Objective  Left Lateral Plantar Surface (3): Verrucous papules -- Discussed viral etiology and contagion.   Objective  Left Shoulder - Posterior: Verrucous papules -- Discussed viral etiology and contagion.   Assessment & Plan  Hand dermatitis Right Hand - Posterior  Use a moisturizing cream QD to hands and Vaseline to the affected area Deferred topical steroid at this time  Inflamed seborrheic keratosis Left Upper Back  Symptomatic  Liquid nitrogen was applied for 10-12 seconds to the skin lesion and the expected blistering or scabbing reaction explained. Do not pick at the area. Patient reminded to expect hypopigmented scars from the procedure. Return if lesion fails to  fully resolve.  Destruction of lesion - Left Upper Back  Destruction method: cryotherapy   Informed consent: discussed and consent obtained   Lesion destroyed using liquid nitrogen: Yes   Outcome: patient tolerated procedure well with no complications   Post-procedure details: wound care instructions given    Onychomycosis Right Hallux Toe Nail Plate  Patient with a history of onychomycosis with good response to pulsed terbinafine and without any issues with treatment.  Will order Terbinafine 250mg  qd x 7 days repeat every other months for 7 months if Hepatic function panel is WNL   Advised should use OTC antifungal powder, sprays or creams very regularly for prevention once nails clear.  Hepatic Function Panel - Right Hallux Toe Nail Plate  Plantar wart (3) Left Lateral Plantar Surface  Liquid nitrogen was applied for 10-12 seconds to the skin lesion and the expected blistering or scabbing reaction explained. Do not pick at the area. Patient reminded to expect hypopigmented scars from the procedure. Return if lesion fails to fully resolve.  Destruction of lesion - Left Lateral Plantar Surface  Destruction method: cryotherapy   Informed consent: discussed and consent obtained   Lesion destroyed using liquid nitrogen: Yes   Outcome: patient tolerated procedure well with no complications   Post-procedure details: wound care instructions given    Viral warts, unspecified type Left Shoulder - Posterior  Destruction of lesion - Left Shoulder - Posterior  Destruction method: cryotherapy   Informed consent: discussed and consent obtained   Lesion destroyed using liquid nitrogen: Yes   Outcome: patient tolerated procedure well with no complications   Post-procedure  details: wound care instructions given    Return in about 1 month (around 06/22/2019) for Wart follow up 1 month,   TBSE 4 months.   IDonzetta Kohut, CMA, am acting as scribe for [name], MD .  Documentation:  I have reviewed the above documentation for accuracy and completeness, and I agree with the above.  [name], MD

## 2019-05-23 NOTE — Patient Instructions (Signed)
Liquid nitrogen was applied for 10-12 seconds to the skin lesion and the expected blistering or scabbing reaction explained. Do not pick at the area. Patient reminded to expect hypopigmented scars from the procedure. Return if lesion fails to fully resolve.

## 2019-05-24 ENCOUNTER — Ambulatory Visit: Payer: Managed Care, Other (non HMO) | Admitting: Dermatology

## 2019-05-27 ENCOUNTER — Telehealth: Payer: Self-pay

## 2019-05-27 NOTE — Telephone Encounter (Signed)
Hepatic function panel wnl. Please send in Terbinafine 250mg  qd x 7 days, repeat every other month (beginning of months 1, 3, 5, and 7) for 7 months total. Dispense #[id number]. Thank you!

## 2019-05-27 NOTE — Telephone Encounter (Signed)
Patient had Hepatic Function Panel done 05/26/19 at [facility]. Labs are in patients chart and you can view with Care Everywhere.

## 2019-05-30 MED ORDER — TERBINAFINE HCL 250 MG PO TABS: 250.0000 mg | ORAL_TABLET | ORAL | 0 refills | Status: DC

## 2019-05-30 NOTE — Telephone Encounter (Signed)
Prescription sent in and left patient message to return my call.

## 2019-06-01 NOTE — Telephone Encounter (Signed)
She can start the terbinafine at any time. There is no issue with timing with the COVID vaccine. Thanks

## 2019-06-01 NOTE — Telephone Encounter (Signed)
Left second message for patient to return my call.

## 2019-06-01 NOTE — Telephone Encounter (Signed)
Patient gave me the OK to leave msg when I spoke with her earlier. Called and left patient detailed msg per Dr. Laurence Ferrari that medication has no issues with timing and the COVID vaccine.

## 2019-06-01 NOTE — Telephone Encounter (Signed)
Spoke with patient and advised her of information and prescription sent in.   Patient is receiving her second COVID vaccine next month on 06/21/19. When should she start the Terbinafine?

## 2019-06-24 ENCOUNTER — Ambulatory Visit: Payer: Managed Care, Other (non HMO) | Admitting: Dermatology

## 2019-08-08 ENCOUNTER — Ambulatory Visit
Admission: RE | Admit: 2019-08-08 | Discharge: 2019-08-08 | Disposition: A | Discharge status: Home / Self Care | Payer: Managed Care, Other (non HMO) | Source: Ambulatory Visit | Attending: Physician Assistant | Admitting: Physician Assistant

## 2019-08-08 ENCOUNTER — Other Ambulatory Visit: Payer: Self-pay | Admitting: Physician Assistant

## 2019-08-08 ENCOUNTER — Other Ambulatory Visit: Payer: Self-pay

## 2019-08-08 DIAGNOSIS — R1011 Right upper quadrant pain: Secondary | ICD-10-CM

## 2019-08-08 DIAGNOSIS — R109 Unspecified abdominal pain: Secondary | ICD-10-CM

## 2019-08-09 ENCOUNTER — Other Ambulatory Visit: Payer: Self-pay | Admitting: Physician Assistant

## 2019-08-09 DIAGNOSIS — R1011 Right upper quadrant pain: Secondary | ICD-10-CM

## 2019-08-10 ENCOUNTER — Other Ambulatory Visit: Payer: Self-pay

## 2019-08-10 ENCOUNTER — Ambulatory Visit
Admission: RE | Admit: 2019-08-10 | Discharge: 2019-08-10 | Disposition: A | Discharge status: Home / Self Care | Payer: Managed Care, Other (non HMO) | Source: Ambulatory Visit | Attending: Physician Assistant | Admitting: Physician Assistant

## 2019-08-10 DIAGNOSIS — R1011 Right upper quadrant pain: Secondary | ICD-10-CM | POA: Diagnosis present

## 2019-10-12 ENCOUNTER — Other Ambulatory Visit: Payer: Self-pay

## 2019-10-12 ENCOUNTER — Ambulatory Visit: Payer: Managed Care, Other (non HMO) | Admitting: Dermatology

## 2019-10-12 DIAGNOSIS — Z1283 Encounter for screening for malignant neoplasm of skin: Secondary | ICD-10-CM

## 2019-10-12 DIAGNOSIS — B078 Other viral warts: Secondary | ICD-10-CM

## 2019-10-12 DIAGNOSIS — L570 Actinic keratosis: Secondary | ICD-10-CM | POA: Diagnosis not present

## 2019-10-12 DIAGNOSIS — L578 Other skin changes due to chronic exposure to nonionizing radiation: Secondary | ICD-10-CM

## 2019-10-12 DIAGNOSIS — D1800 Hemangioma unspecified site: Secondary | ICD-10-CM

## 2019-10-12 DIAGNOSIS — L821 Other seborrheic keratosis: Secondary | ICD-10-CM

## 2019-10-12 DIAGNOSIS — D229 Melanocytic nevi, unspecified: Secondary | ICD-10-CM

## 2019-10-12 DIAGNOSIS — D485 Neoplasm of uncertain behavior of skin: Secondary | ICD-10-CM

## 2019-10-12 DIAGNOSIS — B351 Tinea unguium: Secondary | ICD-10-CM | POA: Diagnosis not present

## 2019-10-12 DIAGNOSIS — L814 Other melanin hyperpigmentation: Secondary | ICD-10-CM

## 2019-10-12 NOTE — Progress Notes (Signed)
Follow-Up Visit   Subjective  Debra Dean is a 59 y.o. female who presents for the following: full body skin exam and skin cancer screening  Patient here today for full body skin exam and skin cancer screening. She does have a history of AK's and does have a few scaly spots at face and arms she would like checked.   She is taking PO terbinafine for onychomycosis but notes nails are still thickened. Tolerating medicine well.   The following portions of the chart were reviewed this encounter and updated as appropriate:  Tobacco  Allergies  Meds  Problems  Med Hx  Surg Hx  Fam Hx      Review of Systems:  No other skin or systemic complaints except as noted in HPI or Assessment and Plan.  Objective  Well appearing patient in no apparent distress; mood and affect are within normal limits.  A full examination was performed including scalp, head, eyes, ears, nose, lips, neck, chest, axillae, abdomen, back, buttocks, bilateral upper extremities, bilateral lower extremities, hands, feet, fingers, toes, fingernails, and toenails. All findings within normal limits unless otherwise noted below.  Objective  Right dorsal forearm x 1: Erythematous thin papules/macules with gritty scale.   Objective  Left lateral plantar x 4: Verrucous papules  Objective  Left Mid Back: 0.9cm erythematous pink papule   Assessment & Plan  AK (actinic keratosis) Right dorsal forearm x 1  Prior to procedure, discussed risks of blister formation, small wound, skin dyspigmentation, or rare scar following cryotherapy.    Destruction of lesion - Right dorsal forearm x 1 Complexity: simple   Destruction method: cryotherapy   Informed consent: discussed and consent obtained   Lesion destroyed using liquid nitrogen: Yes   Cryotherapy cycles:  2 Outcome: patient tolerated procedure well with no complications   Post-procedure details: wound care instructions given    Onychomycosis Right great toe  nail  Continue terbinafine 250mg  qd x 7 days repeating every other month  Other viral warts Left lateral plantar x 4  Destruction of lesion - Left lateral plantar x 4 Complexity: simple   Destruction method: chemical removal   Chemical destruction method: cantharidin   Chemical destruction method comment:  Plus Application time:  4 hours Procedure instructions: patient instructed to wash and dry area   Outcome: patient tolerated procedure well with no complications    Neoplasm of uncertain behavior of skin Left Mid Back  Irritated Nevus vs Fibrolipoma vs Other  Consider shave removal at follow up.    Lentigines - Scattered tan macules - Discussed due to sun exposure - Benign, observe - Call for any changes  Seborrheic Keratoses - Stuck-on, waxy, tan-brown papules and plaques  - Discussed benign etiology and prognosis. - Observe - Call for any changes  Melanocytic Nevi - Tan-brown and/or pink-flesh-colored symmetric macules and papules - Benign appearing on exam today - Observation - Call clinic for new or changing moles - Recommend daily use of broad spectrum spf 30+ sunscreen to sun-exposed areas.   Hemangiomas - Red papules - Discussed benign nature - Observe - Call for any changes  Actinic Damage - diffuse scaly erythematous macules with underlying dyspigmentation - Recommend daily broad spectrum sunscreen SPF 30+ to sun-exposed areas, reapply every 2 hours as needed.  - Call for new or changing lesions.  Skin cancer screening performed today.   Return in about 1 month (around 11/12/2019) for warts, 1 year for TBSE.  Graciella Belton, RMA, am acting as scribe  for [name], MD .  Documentation: I have reviewed the above documentation for accuracy and completeness, and I agree with the above.  [name], MD

## 2019-10-12 NOTE — Patient Instructions (Addendum)
Cryotherapy Aftercare  . Wash gently with soap and water everyday.   Marland Kitchen Apply Vaseline and Band-Aid daily until healed.  Prior to procedure, discussed risks of blister formation, small wound, skin dyspigmentation, or rare scar following cryotherapy.   Melanoma ABCDEs  Melanoma is the most dangerous type of skin cancer, and is the leading cause of death from skin disease.  You are more likely to develop melanoma if you:  Have light-colored skin, light-colored eyes, or red or blond hair  Spend a lot of time in the sun  Tan regularly, either outdoors or in a tanning bed  Have had blistering sunburns, especially during childhood  Have a close family member who has had a melanoma  Have atypical moles or large birthmarks  Early detection of melanoma is key since treatment is typically straightforward and cure rates are extremely high if we catch it early.   The first sign of melanoma is often a change in a mole or a new dark spot.  The ABCDE system is a [place] of remembering the signs of melanoma.  A for asymmetry:  The two halves do not match. B for border:  The edges of the growth are irregular. C for color:  A mixture of colors are present instead of an even brown color. D for diameter:  Melanomas are usually (but not always) greater than 75mm - the size of a pencil eraser. E for evolution:  The spot keeps changing in size, shape, and color.  Please check your skin once per month between visits. You can use a small mirror in front and a large mirror behind you to keep an eye on the back side or your body.   If you see any new or changing lesions before your next follow-up, please call to schedule a visit.  Please continue daily skin protection including broad spectrum sunscreen SPF 30+ to sun-exposed areas, reapplying every 2 hours as needed when you're outdoors.   Recommend taking Heliocare sun protection supplement daily in sunny weather for additional sun protection. For maximum  protection on the sunniest days, you can take up to 2 capsules of regular Heliocare OR take 1 capsule of Heliocare Ultra. For prolonged exposure (such as a full day in the sun), you can repeat your dose of the supplement 4 hours after your first dose. Heliocare can be purchased at [facility] or at [email or web address].   Instructions for After In-Office Application of Cantharidin  1. This is a strong medicine; please follow ALL instructions.  2. Gently wash off with soap and water in four hours or sooner s directed by your physician.  3. **WARNING** this medicine can cause severe blistering, blood blisters, infection, and/or scarring if it is not washed off as directed.  4. Your progress will be rechecked in 1-2 months; call sooner if there are any questions or problems.

## 2019-10-18 ENCOUNTER — Encounter: Payer: Self-pay | Admitting: Dermatology

## 2019-10-18 NOTE — Addendum Note (Signed)
Addended by: Alfonso Patten on: 10/18/2019 01:20 AM   Modules accepted: Level of Service

## 2020-02-03 ENCOUNTER — Ambulatory Visit: Payer: Managed Care, Other (non HMO) | Attending: Internal Medicine

## 2020-02-03 ENCOUNTER — Other Ambulatory Visit: Payer: Self-pay | Admitting: Internal Medicine

## 2020-02-03 DIAGNOSIS — Z23 Encounter for immunization: Secondary | ICD-10-CM

## 2020-02-03 NOTE — Progress Notes (Signed)
   Covid-19 Vaccination Clinic  Name:  Debra Dean    MRN: [id number] DOB: 21-Jan-1961  02/03/2020  [name] was observed post Covid-19 immunization for 15 minutes without incident. She was provided with Vaccine Information Sheet and instruction to access the V-Safe system.   [name] was instructed to call 911 with any severe reactions post vaccine: Marland Kitchen Difficulty breathing  . Swelling of face and throat  . A fast heartbeat  . A bad rash all over body  . Dizziness and weakness   Immunizations Administered    Name Date Dose VIS Date Route   Pfizer COVID-19 Vaccine 02/03/2020 12:02 PM 0.3 mL 12/07/2019 Intramuscular   Manufacturer: [place]   Lot: [id number]   [place]: [id number]

## 2020-04-10 NOTE — Progress Notes (Addendum)
Chief Complaint  Patient presents with  . Gynecologic Exam    HPI:      [name] is a 60 y.o. G1P1 who LMP was No LMP recorded. Patient has had a hysterectomy., presents today for her annual examination.  Her menses are absent due to hysterectomy. She does not have PMB.  She does not have vasomotor sx.   Occas still has irritation in perianal area, but has hemorrhoids, with occas blood with wiping. Has some ext vaginal irritation as well at times. Improved with lotrisone crm prn. Needs RF. Tries to keep area dry.    Sex activity: single partner, contraception - status post hysterectomy. She does have vaginal dryness and sometimes a small amount of red blood with sex, improved with lubricants. She can't tolerate oral, topical or patch ERT due to headaches.  Last Pap: 03/29/18  Results were: no abnormalities /neg HPV DNA.  Hx of STDs: HSV  Last mammogram: 03/15/19 Results were: normal--routine follow-up in 12 months There is a FH of breast cancer in her mat aunt, BRCA genetic testing not indicated. There is no FH of ovarian cancer, but there is a FH of nonmetastatic prostate cancer in 2 mat uncles, uterine cancer in her mat aunt, and colon cancer in her MGF. The patient does not do self-breast exams.  Colonoscopy: 2018 with 1 polyp. Repeat due after 5 yrs. FH colon cancer in her MGF.  Tobacco use: The patient denies current or previous tobacco use. Alcohol use: none No drug use. Exercise: mod active  She does get adequate calcium and Vitamin D in her diet. She has labs with PCP.  Past Medical History:  Diagnosis Date  . Anginal pain ([place])   . Carpal tunnel syndrome    mild-mod left, mild right  . Central sleep apnea   . Dyspnea    had stress test and negative having another sleep study and MRI of brain  . GERD (gastroesophageal reflux disease)   . Headache   . Herpes genitalis   . History of abnormal Pap smear   . Hypertension   . IBS (irritable bowel syndrome)   .  Migraine   . Osteoarthritis   . Ovarian cyst   . Recurrent sinusitis     Past Surgical History:  Procedure Laterality Date  . ABDOMINAL HYSTERECTOMY  2005  . bilateral carpal tunnel release  2006  . CERVICAL CONE BIOPSY  1986  . COLONOSCOPY WITH PROPOFOL N/A 06/23/2016   Procedure: COLONOSCOPY WITH PROPOFOL;  Surgeon: Lollie Sails, MD;  Location: [facility] ENDOSCOPY;  Service: Endoscopy;  Laterality: N/A;  . ENDOMETRIAL ABLATION  2001  . ESOPHAGOGASTRODUODENOSCOPY (EGD) WITH PROPOFOL N/A 06/23/2016   Procedure: ESOPHAGOGASTRODUODENOSCOPY (EGD) WITH PROPOFOL;  Surgeon: Lollie Sails, MD;  Location: [facility] ENDOSCOPY;  Service: Endoscopy;  Laterality: N/A;  . RHINOPLASTY  2004  . TONSILLECTOMY  1965  . TOTAL ABDOMINAL HYSTERECTOMY  2002   LSO has right ovary and tube, MAD  . TUBAL LIGATION      Family History  Problem Relation Age of Onset  . Hypertension Mother   . Endometriosis Sister   . Asthma Brother   . Breast cancer Maternal Aunt        70's, tested and treated, has contact w her  . Diabetes Father        DM Type 2  . Hypertension Father   . Prostate cancer Father   . Other Father        Pre Leukemia  .  Colon cancer Maternal Grandfather 22  . Uterine cancer Maternal Aunt 63  . Prostate cancer Maternal Uncle        no mets  . Prostate cancer Maternal Uncle        no mets  . Leukemia Maternal Grandmother     Social History   Socioeconomic History  . Marital status: Married    Spouse name: Not on file  . Number of children: 1  . Years of education: Not on file  . Highest education level: Not on file  Occupational History  . Not on file  Tobacco Use  . Smoking status: Former Smoker    Quit date: 02/18/1995    Years since quitting: 25.1  . Smokeless tobacco: Never Used  Vaping Use  . Vaping Use: Never used  Substance and Sexual Activity  . Alcohol use: No  . Drug use: No  . Sexual activity: Yes    Birth control/protection: Surgical    Comment:  Hysterectomy  Other Topics Concern  . Not on file  Social History Narrative  . Not on file   Social Determinants of Health   Financial Resource Strain: Not on file  Food Insecurity: Not on file  Transportation Needs: Not on file  Physical Activity: Not on file  Stress: Not on file  Social Connections: Not on file  Intimate Partner Violence: Not on file     Current Outpatient Medications:  .  albuterol (PROVENTIL HFA;VENTOLIN HFA) 108 (90 Base) MCG/ACT inhaler, Inhale into the lungs., Disp: , Rfl:  .  aluminum hydroxide-magnesium carbonate (GAVISCON) 95-358 MG/15ML SUSP, Take by mouth., Disp: , Rfl:  .  amLODipine (NORVASC) 2.5 MG tablet, Take 2.5 mg by mouth daily., Disp: , Rfl:  .  cholecalciferol (VITAMIN D) 400 units TABS tablet, Take 1,000 Units by mouth., Disp: , Rfl:  .  fluticasone (FLONASE) 50 MCG/ACT nasal spray, Place 2 sprays into the nose daily., Disp: , Rfl:  .  metoprolol succinate (TOPROL-XL) 25 MG 24 hr tablet, TK 1/2 T PO QD, Disp: , Rfl:  .  potassium chloride (K-DUR) 10 MEQ tablet, Take 10 mEq by mouth daily., Disp: , Rfl:  .  RABEprazole (ACIPHEX) 20 MG tablet, Take 20 mg by mouth daily., Disp: , Rfl:  .  sodium chloride (OCEAN) 0.65 % nasal spray, Place into the nose., Disp: , Rfl:  .  terbinafine (LAMISIL) 250 MG tablet, Take 1 tablet (250 mg total) by mouth as directed. Take 1 PO for 7 days at the beginning of each month for months 1,3,5,7, Disp: 28 tablet, Rfl: 0 .  triamterene-hydrochlorothiazide (MAXZIDE-25) 37.5-25 MG per tablet, 1/2 tablet q day, Disp: , Rfl:  .  umeclidinium-vilanterol (ANORO ELLIPTA) 62.5-25 MCG/INH AEPB, Inhale into the lungs., Disp: , Rfl:  .  vitamin B-12 (CYANOCOBALAMIN) 1000 MCG tablet, Take by mouth., Disp: , Rfl:  .  clotrimazole-betamethasone (LOTRISONE) cream, Apply externally BID prn sx up to 2 wks, Disp: 15 g, Rfl: 1   ROS:  Review of Systems  Constitutional: Negative for fatigue, fever and unexpected weight change.   Respiratory: Negative for cough, shortness of breath and wheezing.   Cardiovascular: Negative for chest pain, palpitations and leg swelling.  Gastrointestinal: Negative for blood in stool, constipation, diarrhea, nausea and vomiting.  Endocrine: Negative for cold intolerance, heat intolerance and polyuria.  Genitourinary: Negative for dyspareunia, dysuria, flank pain, frequency, genital sores, hematuria, menstrual problem, pelvic pain, urgency, vaginal bleeding, vaginal discharge and vaginal pain.  Musculoskeletal: Negative for arthralgias, back pain,  joint swelling and myalgias.  Skin: Negative for rash.  Neurological: Negative for dizziness, syncope, light-headedness, numbness and headaches.  Hematological: Negative for adenopathy.  Psychiatric/Behavioral: Negative for agitation, confusion, sleep disturbance and suicidal ideas. The patient is not nervous/anxious.   BREAST: tenderness   Objective: BP 100/70   Ht 5' 2.5" (1.588 m)   Wt 140 lb (63.5 kg)   BMI 25.20 kg/m    Physical Exam Constitutional:      Appearance: She is well-developed.  Genitourinary:     Vulva and vagina normal.     Genitourinary Comments: UTERUS/CX SURG REM     Right Labia: No rash, tenderness or lesions.    Left Labia: No lesions or rash.    Vaginal cuff intact.    No vaginal discharge, erythema or tenderness.      Right Adnexa: not tender and no mass present.    Left Adnexa: not tender and no mass present.    Cervix is not absent.     Uterus is not absent. Breasts:     Right: No mass, nipple discharge, skin change or tenderness.     Left: No mass, nipple discharge, skin change or tenderness.    Neck:     Thyroid: No thyromegaly.  Cardiovascular:     Rate and Rhythm: Normal rate and regular rhythm.     Heart sounds: Normal heart sounds. No murmur heard.   Pulmonary:     Effort: Pulmonary effort is normal.     Breath sounds: Normal breath sounds.  Abdominal:     Palpations: Abdomen is  soft.     Tenderness: There is no abdominal tenderness. There is no guarding.  Musculoskeletal:        General: Normal range of motion.     Cervical back: Normal range of motion.  Neurological:     General: No focal deficit present.     Mental Status: She is alert and oriented to person, place, and time.     Cranial Nerves: No cranial nerve deficit.  Skin:    General: Skin is warm and dry.  Psychiatric:        Mood and Affect: Mood normal.        Behavior: Behavior normal.        Thought Content: Thought content normal.        Judgment: Judgment normal.  Vitals reviewed.     Assessment/Plan: Encounter for annual routine gynecological examination  Encounter for screening mammogram for malignant neoplasm of breast - Plan: MM 3D SCREEN BREAST BILATERAL; pt to sched mammo  Acute vaginitis - Plan: clotrimazole-betamethasone (LOTRISONE) cream; Rx RF  Fam history of multiple cancers--doesn't qualify for [facility]/MyRisk testing. Will cont to follow FH.          Meds ordered this encounter  Medications  . clotrimazole-betamethasone (LOTRISONE) cream    Sig: Apply externally BID prn sx up to 2 wks    Dispense:  15 g    Refill:  1    Order Specific Question:   Supervising Provider    Answer:   Gae Dry [[id number]]    GYN counsel mammography screening, menopause, adequate intake of calcium and vitamin D, diet and exercise     F/U  Return in about 1 year (around 04/11/2021).  Dayzha Pogosyan B. Mckenzye Cutright, PA-C 04/11/2020 10:55 AM

## 2020-04-11 ENCOUNTER — Encounter: Payer: Self-pay | Admitting: Obstetrics and Gynecology

## 2020-04-11 ENCOUNTER — Other Ambulatory Visit: Payer: Self-pay

## 2020-04-11 ENCOUNTER — Ambulatory Visit (INDEPENDENT_AMBULATORY_CARE_PROVIDER_SITE_OTHER): Payer: Managed Care, Other (non HMO) | Admitting: Obstetrics and Gynecology

## 2020-04-11 VITALS — BP 100/70 | Ht 62.5 in | Wt 140.0 lb

## 2020-04-11 DIAGNOSIS — Z1231 Encounter for screening mammogram for malignant neoplasm of breast: Secondary | ICD-10-CM

## 2020-04-11 DIAGNOSIS — Z01419 Encounter for gynecological examination (general) (routine) without abnormal findings: Secondary | ICD-10-CM

## 2020-04-11 DIAGNOSIS — N760 Acute vaginitis: Secondary | ICD-10-CM

## 2020-04-11 MED ORDER — CLOTRIMAZOLE-BETAMETHASONE 1-0.05 % EX CREA: TOPICAL_CREAM | CUTANEOUS | 1 refills | Status: DC

## 2020-04-11 NOTE — Patient Instructions (Signed)
I value your feedback and you entrusting us with your care. If you get a [facility] patient survey, I would appreciate you taking the time to let us know about your experience today. Thank you!  [facility]: [phone]      

## 2020-06-07 ENCOUNTER — Other Ambulatory Visit: Payer: Self-pay | Admitting: Dermatology

## 2020-06-07 NOTE — Telephone Encounter (Signed)
Discussed refill with patient and medication sent to pharmacy. Advised patient to keep follow up appointment in June.

## 2020-06-28 ENCOUNTER — Ambulatory Visit
Admission: RE | Admit: 2020-06-28 | Discharge: 2020-06-28 | Disposition: A | Discharge status: Home / Self Care | Payer: Managed Care, Other (non HMO) | Source: Ambulatory Visit | Attending: Obstetrics and Gynecology | Admitting: Obstetrics and Gynecology

## 2020-06-28 ENCOUNTER — Other Ambulatory Visit: Payer: Self-pay

## 2020-06-28 DIAGNOSIS — Z1231 Encounter for screening mammogram for malignant neoplasm of breast: Secondary | ICD-10-CM | POA: Diagnosis not present

## 2020-07-19 ENCOUNTER — Other Ambulatory Visit: Payer: Self-pay

## 2020-07-19 ENCOUNTER — Ambulatory Visit: Payer: Managed Care, Other (non HMO) | Admitting: Dermatology

## 2020-07-19 DIAGNOSIS — L570 Actinic keratosis: Secondary | ICD-10-CM

## 2020-07-19 DIAGNOSIS — D485 Neoplasm of uncertain behavior of skin: Secondary | ICD-10-CM | POA: Diagnosis not present

## 2020-07-19 DIAGNOSIS — L609 Nail disorder, unspecified: Secondary | ICD-10-CM

## 2020-07-19 MED ORDER — FLUCONAZOLE 200 MG PO TABS: ORAL_TABLET | ORAL | 0 refills | Status: DC

## 2020-07-19 NOTE — Patient Instructions (Addendum)
Wound Care Instructions  1. Cleanse wound gently with soap and water once a day then pat dry with clean gauze. Apply a thing coat of Petrolatum (petroleum jelly, "Vaseline") over the wound (unless you have an allergy to this). We recommend that you use a new, sterile tube of Vaseline. Do not pick or remove scabs. Do not remove the yellow or white "healing tissue" from the base of the wound.  2. Cover the wound with fresh, clean, nonstick gauze and secure with paper tape. You may use Band-Aids in place of gauze and tape if the would is small enough, but would recommend trimming much of the tape off as there is often too much. Sometimes Band-Aids can irritate the skin.  3. You should call the office for your biopsy report after 1 week if you have not already been contacted.  4. If you experience any problems, such as abnormal amounts of bleeding, swelling, significant bruising, significant pain, or evidence of infection, please call the office immediately.  5. FOR ADULT SURGERY PATIENTS: If you need something for pain relief you may take 1 extra strength Tylenol (acetaminophen) AND 2 Ibuprofen (200mg  each) together every 4 hours as needed for pain. (do not take these if you are allergic to them or if you have a reason you should not take them.) Typically, you may only need pain medication for 1 to 3 days.   Cryotherapy Aftercare  . Wash gently with soap and water everyday.   Marland Kitchen Apply Vaseline and Band-Aid daily until healed.  Prior to procedure, discussed risks of blister formation, small wound, skin dyspigmentation, or rare scar following cryotherapy.   Start fluconazole 200mg  once weekly.    Side effects of fluconazole (diflucan) include nausea, diarrhea, headache, dizziness, taste changes, rare risk of irritation of the liver, allergy, or decreased blood counts (which could show up as infection or tiredness).  If you have any questions or concerns for your doctor, please call our main line at  [phone] and press option 4 to reach your doctor's medical assistant. If no one answers, please leave a voicemail as directed and we will return your call as soon as possible. Messages left after 4 pm will be answered the following business day.   You may also send [place] a message via [place]. We typically respond to MyChart messages within 1-2 business days.  For prescription refills, please ask your pharmacy to contact our office. Our fax number is [phone].  If you have an urgent issue when the clinic is closed that cannot wait until the next business day, you can page your doctor at the number below.    Please note that while we do our best to be available for urgent issues outside of office hours, we are not available 24/7.   If you have an urgent issue and are unable to reach [place], you may choose to seek medical care at your doctor's office, retail clinic, urgent care center, or emergency room.  If you have a medical emergency, please immediately call 911 or go to the emergency department.  Pager Numbers  - Dr. Nehemiah Massed: [phone]  - Dr. Laurence Ferrari: [phone]  - Dr. Nicole Kindred: [phone]  In the event of inclement weather, please call our main line at [phone] for an update on the status of any delays or closures.  Dermatology Medication Tips: Please keep the boxes that topical medications come in in order to help keep track of the instructions about where and how to use these. Pharmacies typically  print the medication instructions only on the boxes and not directly on the medication tubes.   If your medication is too expensive, please contact our office at [phone] option 4 or send [place] a message through [place].   We are unable to tell what your co-pay for medications will be in advance as this is different depending on your insurance coverage. However, we may be able to find a substitute medication at lower cost or fill out paperwork to get insurance to cover a needed  medication.   If a prior authorization is required to get your medication covered by your insurance company, please allow [place] 1-2 business days to complete this process.  Drug prices often vary depending on where the prescription is filled and some pharmacies may offer cheaper prices.  The website www.goodrx.com contains coupons for medications through different pharmacies. The prices here do not account for what the cost may be with help from insurance (it may be cheaper with your insurance), but the website can give you the price if you did not use any insurance.  - You can print the associated coupon and take it with your prescription to the pharmacy.  - You may also stop by our office during regular business hours and pick up a GoodRx coupon card.  - If you need your prescription sent electronically to a different pharmacy, notify our office through [facility] or by phone at [phone] option 4.

## 2020-07-19 NOTE — Progress Notes (Signed)
Follow-Up Visit   Subjective  Debra Dean is a 60 y.o. female who presents for the following: Skin Problem (Patient with a spot at left shoulder that is irritated. She also has a mole at left lower back that is getting larger and irritated and she would like removed.) and Nail Problem (Patient taking pulse dosing terbinafine for toenails, she has noticed improvement except for right 2nd toe that is thick and dull. ).  The following portions of the chart were reviewed this encounter and updated as appropriate:   Tobacco  Allergies  Meds  Problems  Med Hx  Surg Hx  Fam Hx       Review of Systems:  No other skin or systemic complaints except as noted in HPI or Assessment and Plan.  Objective  Well appearing patient in no apparent distress; mood and affect are within normal limits.  A focused examination was performed including back, shoulder, face. Relevant physical exam findings are noted in the Assessment and Plan.  Left Superior Shoulder 0.5cm scaly pink papule with cutaneous horn R/o Verruca vs SCC vs Hypertrophic AK     Left Mid Back 1.2cm soft pink papule R/o Irritated Nevus vs Other     Left Glabella Erythematous thin papules/macules with gritty scale.   toenails Thickened toenail with subungual debris   Assessment & Plan  Neoplasm of uncertain behavior of skin (2) Left Superior Shoulder  Skin / nail biopsy Type of biopsy: tangential   Informed consent: discussed and consent obtained   Timeout: patient name, date of birth, surgical site, and procedure verified   Patient was prepped and draped in usual sterile fashion: Area prepped with isopropyl alcohol. Anesthesia: the lesion was anesthetized in a standard fashion   Local anesthetic: 0.5% bupivicaine. Instrument used: flexible razor blade   Hemostasis achieved with: aluminum chloride   Outcome: patient tolerated procedure well   Post-procedure details: wound care instructions given   Additional  details:  Mupirocin and a bandage applied  Left Mid Back  Epidermal / dermal shaving  Lesion diameter (cm):  1.2 Informed consent: discussed and consent obtained   Timeout: patient name, date of birth, surgical site, and procedure verified   Patient was prepped and draped in usual sterile fashion: area prepped with isopropyl alcohol. Anesthesia: the lesion was anesthetized in a standard fashion   Anesthetic:  1% lidocaine w/ epinephrine 1-100,000 buffered w/ 8.4% NaHCO3 (0.5% bupivicaine) Instrument used: flexible razor blade   Hemostasis achieved with: aluminum chloride   Outcome: patient tolerated procedure well   Post-procedure details: wound care instructions given   Additional details:  Mupirocin and a bandage applied  Curettage and LN2 to base at left superior shoulder  Related Procedures Anatomic Pathology Report  AK (actinic keratosis) Left Glabella  Actinic keratoses are precancerous spots that appear secondary to cumulative UV radiation exposure/sun exposure over time. They are chronic with expected duration over 1 year. A portion of actinic keratoses will progress to squamous cell carcinoma of the skin. It is not possible to reliably predict which spots will progress to skin cancer and so treatment is recommended to prevent development of skin cancer.  Recommend daily broad spectrum sunscreen SPF 30+ to sun-exposed areas, reapply every 2 hours as needed.  Recommend staying in the shade or wearing long sleeves, sun glasses (UVA+UVB protection) and wide brim hats (4-inch brim around the entire circumference of the hat). Call for new or changing lesions.  Prior to procedure, discussed risks of blister formation, small  wound, skin dyspigmentation, or rare scar following cryotherapy.    Destruction of lesion - Left Glabella  Destruction method: cryotherapy   Informed consent: discussed and consent obtained   Lesion destroyed using liquid nitrogen: Yes   Cryotherapy  cycles:  2 Outcome: patient tolerated procedure well with no complications   Post-procedure details: wound care instructions given    Nail problem toenails  C/w Onychomycosis Chronic condition with duration or expected duration over one year. Condition is bothersome to patient. Currently not at goal and some nails not responding well to PO terbinafine.   D/c terbinafine. Reviewed CMP.   Start fluconazole 200mg  once weekly.   Will plan to do labs to check liver function in 1 month.  Side effects of fluconazole (diflucan) include nausea, diarrhea, headache, dizziness, taste changes, rare risk of irritation of the liver, allergy, or decreased blood counts (which could show up as infection or tiredness).   Related Procedures Fungus Culture W/Rfx Rapid ID Hepatic Function Panel  Related Medications fluconazole (DIFLUCAN) 200 MG tablet Take once weekly as directed.  Return for as scheduled, TBSE.  Graciella Belton, RMA, am acting as scribe for [name], MD .  Documentation: I have reviewed the above documentation for accuracy and completeness, and I agree with the above.  [name], MD

## 2020-07-24 LAB — ANATOMIC PATHOLOGY REPORT

## 2020-07-25 ENCOUNTER — Telehealth: Payer: Self-pay

## 2020-07-25 NOTE — Telephone Encounter (Signed)
Called patient and discussed biopsy results. Patient verbalized understanding and will continue to watch area at left superior shoulder. She report area is not bothering her and has not noticed any change.She states she will call us if she notices any changes.  Patient reports area at left mid back is a little itchy but denies any redness or pain. Informed patient that was normal in healing process and to keep using Vaseline and band aid until healed. Patient denied further questions at this time.

## 2020-07-25 NOTE — Telephone Encounter (Signed)
-----   Message from [place], MD sent at 07/25/2020  8:30 AM EDT ----- Comment: Specimen A-Skin Excision, left superior shoulder:  HYPERKERATOSIS, PARAKERATOSIS AND SUPERFICIAL PORTION OF  VIABLE EPIDERMIS, "Overlying scale but Unable to give clear diagnosis due to not enough underlying skin tissue" --> Monitor for recurrence. If anything comes back at this area, please call for appointment for repeat biopsy  Specimen B-Skin Excision, left mid back: NEUROFIBROMA. "Benign growth" no additional treatment needed   MAs please call. Thank you!

## 2020-07-30 ENCOUNTER — Encounter: Payer: Self-pay | Admitting: Dermatology

## 2020-08-02 ENCOUNTER — Encounter: Payer: Self-pay | Admitting: Dermatology

## 2020-08-02 LAB — FUNGAL ID BY MOLECULAR METHODS

## 2020-08-02 LAB — FUNGUS CULTURE W/RFX RAPID ID: Fungal Culture W/Rfx: POSITIVE — AB

## 2020-09-05 ENCOUNTER — Other Ambulatory Visit: Payer: Self-pay | Admitting: Dermatology

## 2020-09-05 DIAGNOSIS — L609 Nail disorder, unspecified: Secondary | ICD-10-CM

## 2020-09-10 ENCOUNTER — Telehealth: Payer: Self-pay

## 2020-09-10 DIAGNOSIS — L609 Nail disorder, unspecified: Secondary | ICD-10-CM

## 2020-09-10 NOTE — Telephone Encounter (Signed)
Patient called requesting Fluconazole RF. She did have lab work done at [facility] last week. Advised patient Dr. Laurence Ferrari would be back in the office this week and I will call her once labs were reviewed.

## 2020-09-11 MED ORDER — FLUCONAZOLE 200 MG PO TABS: ORAL_TABLET | ORAL | 5 refills | Status: DC

## 2020-09-11 NOTE — Telephone Encounter (Signed)
Hepatic function panel reviewed and unremarkable. Please refill fluconazole to take once weekly #4, 5 refills. Thank you!

## 2020-09-11 NOTE — Telephone Encounter (Signed)
Patient advised of information per Dr. Laurence Ferrari and RFs sent in.

## 2020-10-25 ENCOUNTER — Encounter: Payer: Managed Care, Other (non HMO) | Admitting: Dermatology

## 2020-11-06 ENCOUNTER — Encounter: Payer: Self-pay | Admitting: Dermatology

## 2020-11-06 ENCOUNTER — Other Ambulatory Visit: Payer: Self-pay

## 2020-11-06 ENCOUNTER — Ambulatory Visit: Payer: Managed Care, Other (non HMO) | Admitting: Dermatology

## 2020-11-06 DIAGNOSIS — L821 Other seborrheic keratosis: Secondary | ICD-10-CM

## 2020-11-06 DIAGNOSIS — L820 Inflamed seborrheic keratosis: Secondary | ICD-10-CM

## 2020-11-06 DIAGNOSIS — L814 Other melanin hyperpigmentation: Secondary | ICD-10-CM | POA: Diagnosis not present

## 2020-11-06 DIAGNOSIS — B351 Tinea unguium: Secondary | ICD-10-CM | POA: Diagnosis not present

## 2020-11-06 DIAGNOSIS — D1800 Hemangioma unspecified site: Secondary | ICD-10-CM

## 2020-11-06 DIAGNOSIS — D229 Melanocytic nevi, unspecified: Secondary | ICD-10-CM

## 2020-11-06 DIAGNOSIS — Z1283 Encounter for screening for malignant neoplasm of skin: Secondary | ICD-10-CM | POA: Diagnosis not present

## 2020-11-06 DIAGNOSIS — L578 Other skin changes due to chronic exposure to nonionizing radiation: Secondary | ICD-10-CM | POA: Diagnosis not present

## 2020-11-06 NOTE — Patient Instructions (Addendum)
Cryotherapy Aftercare  Wash gently with soap and water everyday.   Apply Vaseline and Band-Aid daily until healed.     Recommend OTC Gold Bond Rapid Relief Anti-Itch cream (pramoxine + menthol), CeraVe Anti-itch cream or lotion (pramoxine), or Sarna lotion (Original- menthol + camphor or Sensitive- pramoxine) up to 3 times per day to areas on body that are itchy.      Recommend taking Heliocare sun protection supplement daily in sunny weather for additional sun protection. For maximum protection on the sunniest days, you can take up to 2 capsules of regular Heliocare OR take 1 capsule of Heliocare Ultra. For prolonged exposure (such as a full day in the sun), you can repeat your dose of the supplement 4 hours after your first dose. Heliocare can be purchased at [facility] or at [email or web address].      If you have any questions or concerns for your doctor, please call our main line at [phone] and press option 4 to reach your doctor's medical assistant. If no one answers, please leave a voicemail as directed and we will return your call as soon as possible. Messages left after 4 pm will be answered the following business day.   You may also send [place] a message via [place]. We typically respond to MyChart messages within 1-2 business days.  For prescription refills, please ask your pharmacy to contact our office. Our fax number is [phone].  If you have an urgent issue when the clinic is closed that cannot wait until the next business day, you can page your doctor at the number below.    Please note that while we do our best to be available for urgent issues outside of office hours, we are not available 24/7.   If you have an urgent issue and are unable to reach [place], you may choose to seek medical care at your doctor's office, retail clinic, urgent care center, or emergency room.  If you have a medical emergency, please immediately call 911 or go to the emergency  department.  Pager Numbers  - Dr. Nehemiah Massed: [phone]  - Dr. Laurence Ferrari: [phone]  - Dr. Nicole Kindred: [phone]  In the event of inclement weather, please call our main line at [phone] for an update on the status of any delays or closures.  Dermatology Medication Tips: Please keep the boxes that topical medications come in in order to help keep track of the instructions about where and how to use these. Pharmacies typically print the medication instructions only on the boxes and not directly on the medication tubes.   If your medication is too expensive, please contact our office at [phone] option 4 or send [place] a message through [place].   We are unable to tell what your co-pay for medications will be in advance as this is different depending on your insurance coverage. However, we may be able to find a substitute medication at lower cost or fill out paperwork to get insurance to cover a needed medication.   If a prior authorization is required to get your medication covered by your insurance company, please allow [place] 1-2 business days to complete this process.  Drug prices often vary depending on where the prescription is filled and some pharmacies may offer cheaper prices.  The website www.goodrx.com contains coupons for medications through different pharmacies. The prices here do not account for what the cost may be with help from insurance (it may be cheaper with your insurance), but the website can give you the price  if you did not use any insurance.  - You can print the associated coupon and take it with your prescription to the pharmacy.  - You may also stop by our office during regular business hours and pick up a GoodRx coupon card.  - If you need your prescription sent electronically to a different pharmacy, notify our office through [facility] or by phone at [phone] option 4.

## 2020-11-06 NOTE — Progress Notes (Signed)
Follow-Up Visit   Subjective  Debra Dean is a 60 y.o. female who presents for the following: Annual Exam (Mole check ).  Follow up onychomycosis on the toenails, treating with Diflucan tablet once a week with a good response. Check a growth on the right thigh hx of bleeding when pt shave her legs.  The patient presents for Total-Body Skin Exam (TBSE) for skin cancer screening and mole check.    The following portions of the chart were reviewed this encounter and updated as appropriate:   Tobacco  Allergies  Meds  Problems  Med Hx  Surg Hx  Fam Hx      Review of Systems:  No other skin or systemic complaints except as noted in HPI or Assessment and Plan.  Objective  Well appearing patient in no apparent distress; mood and affect are within normal limits.  A full examination was performed including scalp, head, eyes, ears, nose, lips, neck, chest, axillae, abdomen, back, buttocks, bilateral upper extremities, bilateral lower extremities, hands, feet, fingers, toes, fingernails, and toenails. All findings within normal limits unless otherwise noted below.  toenails Thickened toenail with subungual debris with area of normal appearing nail at proximal-medial border of nail plate  right posterior thigh Erythematous keratotic or waxy stuck-on papule or plaque.    Assessment & Plan  Onychomycosis toenails  Chronic condition with duration or expected duration over one year. Condition is bothersome to patient. Not currently at goal.  Labs reviewed from date 09/10/20 WNL   Continue fluconazole 200 mg once weekly  Inflamed seborrheic keratosis right posterior thigh  Destruction of lesion - right posterior thigh Complexity: simple   Destruction method: cryotherapy   Informed consent: discussed and consent obtained   Timeout:  patient name, date of birth, surgical site, and procedure verified Lesion destroyed using liquid nitrogen: Yes   Region frozen until ice ball  extended beyond lesion: Yes   Outcome: patient tolerated procedure well with no complications   Post-procedure details: wound care instructions given    Lentigines - Scattered tan macules - Due to sun exposure - Benign-appearing, observe - Recommend daily broad spectrum sunscreen SPF 30+ to sun-exposed areas, reapply every 2 hours as needed. - Call for any changes  Seborrheic Keratoses - Stuck-on, waxy, tan-brown papules and/or plaques  - Benign-appearing - Discussed benign etiology and prognosis. - Observe - Call for any changes  Melanocytic Nevi - Tan-brown and/or pink-flesh-colored symmetric macules and papules - Benign appearing on exam today - Observation - Call clinic for new or changing moles - Recommend daily use of broad spectrum spf 30+ sunscreen to sun-exposed areas.   Hemangiomas - Red papules - Discussed benign nature - Observe - Call for any changes  Actinic Damage - Chronic condition, secondary to cumulative UV/sun exposure - diffuse scaly erythematous macules with underlying dyspigmentation - Recommend daily broad spectrum sunscreen SPF 30+ to sun-exposed areas, reapply every 2 hours as needed.  - Staying in the shade or wearing long sleeves, sun glasses (UVA+UVB protection) and wide brim hats (4-inch brim around the entire circumference of the hat) are also recommended for sun protection.  - Call for new or changing lesions.  Skin cancer screening performed today.   Return in about 6 months (around 05/06/2021) for ISK, Onychomycosis, 12 months TBSE .  I, Marye Round, CMA, am acting as scribe for [name], MD .   Documentation: I have reviewed the above documentation for accuracy and completeness, and I agree with the above.  [name], MD

## 2020-11-30 ENCOUNTER — Other Ambulatory Visit: Payer: Self-pay

## 2020-11-30 ENCOUNTER — Encounter: Payer: Self-pay | Admitting: Obstetrics and Gynecology

## 2020-11-30 ENCOUNTER — Ambulatory Visit (INDEPENDENT_AMBULATORY_CARE_PROVIDER_SITE_OTHER): Payer: Managed Care, Other (non HMO) | Admitting: Obstetrics and Gynecology

## 2020-11-30 VITALS — BP 130/70 | Ht 62.0 in | Wt 146.0 lb

## 2020-11-30 DIAGNOSIS — R3121 Asymptomatic microscopic hematuria: Secondary | ICD-10-CM | POA: Diagnosis not present

## 2020-11-30 DIAGNOSIS — R350 Frequency of micturition: Secondary | ICD-10-CM | POA: Diagnosis not present

## 2020-11-30 DIAGNOSIS — N3289 Other specified disorders of bladder: Secondary | ICD-10-CM | POA: Diagnosis not present

## 2020-11-30 DIAGNOSIS — N898 Other specified noninflammatory disorders of vagina: Secondary | ICD-10-CM | POA: Diagnosis not present

## 2020-11-30 LAB — POCT WET PREP WITH KOH
Clue Cells Wet Prep HPF POC: NEGATIVE
KOH Prep POC: NEGATIVE
Trichomonas, UA: NEGATIVE
Yeast Wet Prep HPF POC: NEGATIVE

## 2020-11-30 LAB — POCT URINALYSIS DIPSTICK
Bilirubin, UA: NEGATIVE
Glucose, UA: NEGATIVE
Ketones, UA: NEGATIVE
Leukocytes, UA: NEGATIVE
Nitrite, UA: NEGATIVE
Protein, UA: NEGATIVE
Spec Grav, UA: 1.025 (ref 1.010–1.025)
pH, UA: 5 (ref 5.0–8.0)

## 2020-11-30 MED ORDER — URIBEL 118 MG PO CAPS: 118.0000 mg | ORAL_CAPSULE | Freq: Four times a day (QID) | ORAL | 0 refills | Status: DC | PRN

## 2020-11-30 MED ORDER — FLUCONAZOLE 150 MG PO TABS: 150.0000 mg | ORAL_TABLET | Freq: Once | ORAL | 0 refills | Status: AC

## 2020-11-30 NOTE — Progress Notes (Signed)
[name], MD   Chief Complaint  Patient presents with   Urinary Tract Infection    Frequency/urgency urinating, back pain, no burning   Vaginal Itching    No discharge or odor    HPI:      [name] is a 60 y.o. G1P1 whose LMP was No LMP recorded. Patient has had a hysterectomy., presents today for urinary frequency/urgency/nocturia, with and without good flow, and bilat low back pain since 8/22. No dysuria, flank pain, pelvic pain, fevers. Did have gross hematuria and fevers with initial sx 8/22. Diagnosed with E. Coli on 8/22 C&S and treated with abx. Sx resolved but then recurred 9/22. Treated again for E. Coli and Entero faecalis on C&S but sx persist. Had neg C&S 11/23/20. Drinks 1 caffeine drink daily. Has urology appt in a few wks.   Has been having vaginal irritation externally, no fishy odor, off and on recently. No meds to treat. Hx of irritation in perianal area, but has hemorrhoids, with occas blood with wiping. Has some ext vaginal irritation as well at times. Improved with lotrisone crm prn.   Past Medical History:  Diagnosis Date   Anginal pain ([place])    Carpal tunnel syndrome    mild-mod left, mild right   Central sleep apnea    Dyspnea    had stress test and negative having another sleep study and MRI of brain   GERD (gastroesophageal reflux disease)    Headache    Herpes genitalis    History of abnormal Pap smear    Hypertension    IBS (irritable bowel syndrome)    Migraine    Osteoarthritis    Ovarian cyst    Recurrent sinusitis     Past Surgical History:  Procedure Laterality Date   ABDOMINAL HYSTERECTOMY  2005   bilateral carpal tunnel release  2006   CERVICAL CONE BIOPSY  1986   COLONOSCOPY WITH PROPOFOL N/A 06/23/2016   Procedure: COLONOSCOPY WITH PROPOFOL;  Surgeon: Lollie Sails, MD;  Location[facility] END[facility]COPY;  Service: Endoscopy;  Laterality: N/A;   ENDOMETRIAL ABLATION  2001   ESOPHAGOGASTRODUODENOSCOPY (EGD) WITH  PROPOFOL N/A 06/23/2016   Procedure: ESOPHAGOGASTRODUODENOSCOPY (EGD) WITH PROPOFOL;  Surgeon: Lollie Sails, MD;  Location[facility] ENDOSCOPY;  Service: Endoscopy;  Laterality: N/A;   RHINOPLASTY  2004   TONSILLECTOMY  1965   TOTAL ABDOMINAL HYSTERECTOMY  2002   LSO has right ovary and tube, MAD   TUBAL LIGATION      Family History  Problem Relation Age of Onset   Hypertension Mother    Endometriosis Sister    Asthma Brother    Breast cancer Maternal Aunt        70's, tested and treated, has contact w her   Diabetes Father        DM Type 2   Hypertension Father    Prostate cancer Father    Other Father        Pre Leukemia   Colon cancer Maternal Grandfather 31   Uterine cancer Maternal Aunt 63   Prostate cancer Maternal Uncle        no mets   Prostate cancer Maternal Uncle        no mets   Leukemia Maternal Grandmother     Social History   Socioeconomic History   Marital status: Married    Spouse name: Not on file   Number of children: 1   Years of education: Not on file  Highest education level: Not on file  Occupational History   Not on file  Tobacco Use   Smoking status: Former    Types: Cigarettes    Quit date: 02/18/1995    Years since quitting: 25.8   Smokeless tobacco: Never  Vaping Use   Vaping Use: Never used  Substance and Sexual Activity   Alcohol use: No   Drug use: No   Sexual activity: Yes    Birth control/protection: Surgical    Comment: Hysterectomy  Other Topics Concern   Not on file  Social History Narrative   Not on file   Social Determinants of Health   Financial Resource Strain: Not on file  Food Insecurity: Not on file  Transportation Needs: Not on file  Physical Activity: Not on file  Stress: Not on file  Social Connections: Not on file  Intimate Partner Violence: Not on file    Outpatient Medications Prior to Visit  Medication Sig Dispense Refill   albuterol (PROVENTIL HFA;VENTOLIN HFA) 108 (90 Base) MCG/ACT inhaler  Inhale into the lungs.     aluminum hydroxide-magnesium carbonate (GAVISCON) 95-358 MG/15ML SUSP Take by mouth.     amLODipine (NORVASC) 2.5 MG tablet Take 2.5 mg by mouth daily.     cholecalciferol (VITAMIN D) 400 units TABS tablet Take 1,000 Units by mouth.     clotrimazole-betamethasone (LOTRISONE) cream Apply externally BID prn sx up to 2 wks 15 g 1   COVID-19 mRNA vaccine, Pfizer, 30 MCG/0.3ML injection USE AS DIRECTED .3 mL 0   fluticasone (FLONASE) 50 MCG/ACT nasal spray Place 2 sprays into the nose daily.     metoprolol succinate (TOPROL-XL) 25 MG 24 hr tablet TK 1/2 T PO QD     potassium chloride (K-DUR) 10 MEQ tablet Take 10 mEq by mouth daily.     RABEprazole (ACIPHEX) 20 MG tablet Take 20 mg by mouth daily.     sodium chloride (OCEAN) 0.65 % nasal spray Place into the nose.     terbinafine (LAMISIL) 250 MG tablet TAKE 1 TABLET BY MOUTH FOR 7 DAYS AT THE BEGINNING OF EACH MONTH FOR MONTHS 1,3,5,7 28 tablet 0   triamterene-hydrochlorothiazide (MAXZIDE-25) 37.5-25 MG per tablet 1/2 tablet q day     umeclidinium-vilanterol (ANORO ELLIPTA) 62.5-25 MCG/INH AEPB Inhale into the lungs.     vitamin B-12 (CYANOCOBALAMIN) 1000 MCG tablet Take by mouth.     fluconazole (DIFLUCAN) 200 MG tablet Take once weekly as directed. 4 tablet 5   No facility-administered medications prior to visit.    ROS:  Review of Systems  Constitutional:  Negative for fever.  Gastrointestinal:  Negative for blood in stool, constipation, diarrhea, nausea and vomiting.  Genitourinary:  Positive for dysuria, frequency and urgency. Negative for dyspareunia, flank pain, hematuria, vaginal bleeding, vaginal discharge and vaginal pain.  Musculoskeletal:  Positive for back pain.  Skin:  Negative for rash.  BREAST: No symptoms   OBJECTIVE:   Vitals:  BP 130/70   Ht 5\' 2"  (1.575 m)   Wt 146 lb (66.2 kg)   BMI 26.70 kg/m   Physical Exam Vitals reviewed.  Constitutional:      Appearance: She is  well-developed. She is not ill-appearing or toxic-appearing.  Pulmonary:     Effort: Pulmonary effort is normal.  Abdominal:     Tenderness: There is no right CVA tenderness or left CVA tenderness.  Genitourinary:    General: Normal vulva.     Pubic Area: No rash.      Labia:  Right: No rash, tenderness or lesion.        Left: No rash, tenderness or lesion.      Vagina: Normal. No vaginal discharge, erythema or tenderness.     Cervix: Normal.     Uterus: Normal. Not enlarged and not tender.      Adnexa: Right adnexa normal and left adnexa normal.       Right: No mass or tenderness.         Left: No mass or tenderness.    Musculoskeletal:        General: Normal range of motion.     Cervical back: Normal range of motion.  Skin:    General: Skin is warm and dry.  Neurological:     General: No focal deficit present.     Mental Status: She is alert and oriented to person, place, and time.     Cranial Nerves: No cranial nerve deficit.  Psychiatric:        Mood and Affect: Mood normal.        Behavior: Behavior normal.        Thought Content: Thought content normal.        Judgment: Judgment normal.    Results: Results for orders placed or performed in visit on 11/30/20 (from the past 24 hour(s))  POCT Urinalysis Dipstick     Status: Abnormal   Collection Time: 11/30/20  5:03 PM  Result Value Ref Range   Color, UA yellow    Clarity, UA clear    Glucose, UA Negative Negative   Bilirubin, UA neg    Ketones, UA neg    Spec Grav, UA 1.025 1.010 - 1.025   Blood, UA trace    pH, UA 5.0 5.0 - 8.0   Protein, UA Negative Negative   Urobilinogen, UA     Nitrite, UA neg    Leukocytes, UA Negative Negative   Appearance     Odor    POCT Wet Prep with KOH     Status: Normal   Collection Time: 11/30/20  5:04 PM  Result Value Ref Range   Trichomonas, UA Negative    Clue Cells Wet Prep HPF POC neg    Epithelial Wet Prep HPF POC     Yeast Wet Prep HPF POC neg    Bacteria Wet  Prep HPF POC     RBC Wet Prep HPF POC     WBC Wet Prep HPF POC     KOH Prep POC Negative Negative     Assessment/Plan: Urinary frequency - Plan: fluconazole (DIFLUCAN) 150 MG tablet, POCT Urinalysis Dipstick, Urine Culture, Urinalysis, Routine w reflex microscopic; neg UA except hematuria. Question bladder spasm given previous UTI. Rx uribel prn/d/c caffeine for now. Check C&S and LC UA.   Bladder spasm - try uribel, f/u prn.   Asymptomatic microscopic hematuria - Plan: Urinalysis, Routine w reflex microscopic; check LC UA and C&S.   Vaginal itching - Plan: POCT Wet Prep with KOH; neg wet prep/exam, pos sx. Rx diflucan/ ext lotrisone crm prn. Most likely due to 2 rounds of abx. F/u prn.    Meds ordered this encounter  Medications   fluconazole (DIFLUCAN) 150 MG tablet    Sig: Take 1 tablet (150 mg total) by mouth once for 1 dose.    Dispense:  1 tablet    Refill:  0    Order Specific Question:   Supervising Provider    Answer:   Gae Dry [[id number]]   Meth-Hyo-M  Bl-Na Phos-Ph Sal (URIBEL) 118 MG CAPS    Sig: Take 1 capsule (118 mg total) by mouth 4 (four) times daily as needed.    Dispense:  30 capsule    Refill:  0    Order Specific Question:   Supervising Provider    Answer:   Gae Dry [[id number]]      Return if symptoms worsen or fail to improve.  Roye Gustafson B. Mackson Botz, PA-C 11/30/2020 5:09 PM

## 2020-12-01 ENCOUNTER — Other Ambulatory Visit: Payer: Self-pay | Admitting: Obstetrics

## 2020-12-01 DIAGNOSIS — N3289 Other specified disorders of bladder: Secondary | ICD-10-CM

## 2020-12-01 LAB — URINALYSIS, ROUTINE W REFLEX MICROSCOPIC
Bilirubin, UA: NEGATIVE
Glucose, UA: NEGATIVE
Ketones, UA: NEGATIVE
Leukocytes,UA: NEGATIVE
Nitrite, UA: NEGATIVE
Protein,UA: NEGATIVE
RBC, UA: NEGATIVE
Specific Gravity, UA: >=1.03 — AB (ref 1.005–1.030)
Urobilinogen, Ur: 0.2 mg/dL (ref 0.2–1.0)
pH, UA: 5.5 (ref 5.0–7.5)

## 2020-12-01 MED ORDER — PHENAZOPYRIDINE HCL 200 MG PO TABS: 200.0000 mg | ORAL_TABLET | Freq: Three times a day (TID) | ORAL | 1 refills | Status: DC | PRN

## 2020-12-03 ENCOUNTER — Telehealth: Payer: Self-pay

## 2020-12-03 LAB — URINE CULTURE

## 2020-12-03 NOTE — Telephone Encounter (Signed)
Pt calling after hour nurse 12/01/20 3:28pm; was seen in office yesterday 11/30/20; medication for 2 scripts were supposed to be callin in but only one was recv'd; the medication for bladder spasm is missing; used AZO, no new or worsening sxs.  MMF rx'd pyridium.  Phone call to pt this morning; pt didn't get her medication until yesterday but is feeling some better.  Adv to finish rx even if starts to feel better.

## 2020-12-11 ENCOUNTER — Other Ambulatory Visit: Payer: Self-pay

## 2020-12-11 ENCOUNTER — Ambulatory Visit: Payer: Managed Care, Other (non HMO) | Attending: Internal Medicine

## 2020-12-11 DIAGNOSIS — Z23 Encounter for immunization: Secondary | ICD-10-CM

## 2020-12-11 MED ORDER — PFIZER COVID-19 VAC BIVALENT 30 MCG/0.3ML IM SUSP
INTRAMUSCULAR | 0 refills | Status: DC
  Filled 2020-12-11: qty 0.3, 1d supply, fill #0

## 2020-12-11 NOTE — Progress Notes (Signed)
   Covid-19 Vaccination Clinic  Name:  Debra Dean    MRN: [id number] DOB: [birth year]  12/11/2020  Ms. Trimm was observed post Covid-19 immunization for 15 minutes without incident. She was provided with Vaccine Information Sheet and instruction to access the V-Safe system.   Ms. Kaneko was instructed to call 911 with any severe reactions post vaccine: Difficulty breathing  Swelling of face and throat  A fast heartbeat  A bad rash all over body  Dizziness and weakness   Immunizations Administered     Name Date Dose VIS Date Route   Pfizer Covid-19 Vaccine Bivalent Booster 12/11/2020  9:02 AM 0.3 mL 10/17/2020 Intramuscular   Manufacturer: [place]   Lot: [place]: [place], PharmD, MBA Clinical Acute Care Pharmacist

## 2020-12-17 ENCOUNTER — Ambulatory Visit: Payer: Managed Care, Other (non HMO) | Admitting: Urology

## 2020-12-17 ENCOUNTER — Other Ambulatory Visit: Payer: Self-pay

## 2020-12-17 VITALS — BP 135/75 | HR 61 | Ht 62.0 in | Wt 144.0 lb

## 2020-12-17 DIAGNOSIS — R3915 Urgency of urination: Secondary | ICD-10-CM | POA: Diagnosis not present

## 2020-12-17 DIAGNOSIS — R319 Hematuria, unspecified: Secondary | ICD-10-CM | POA: Diagnosis not present

## 2020-12-17 DIAGNOSIS — R310 Gross hematuria: Secondary | ICD-10-CM | POA: Diagnosis not present

## 2020-12-17 NOTE — Patient Instructions (Signed)

## 2020-12-17 NOTE — Progress Notes (Signed)
12/17/2020 8:58 AM   Debra Dean [birth year] [id number]  Referring provider: [name], MD [facility] [place],  [place] [address]  No chief complaint on file.   HPI: I was consulted to assess the patient's bladder infections.  In August she had blood in urine and left flank pain.  She was having pain with urination.  She describes taking antibiotics and her symptoms cleared but may have returned.  She had a second and perhaps a third antibiotic and then probably a negative culture.  She is almost back to normal but appears that her bladder is currently more frequent than it used to be.  She is voiding every 1-2 hours and her bladder is more urgent but she is not leaking.  She is now getting up once a night.  She was wearing liners at that time for protection but no longer is  Has had a hysterectomy.  Gets infrequent bladder infections  Bowel movements normal.  No neurologic issues.  No previous bladder surgery or kidney stone  She had a normal CT stone protocol June 2021.  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  Used to smoke.  No daily aspirin or blood thinner     PMH: Past Medical History:  Diagnosis Date   Anginal pain (HCC)    Carpal tunnel syndrome    mild-mod left, mild right   Central sleep apnea    Dyspnea    had stress test and negative having another sleep study and MRI of brain   GERD (gastroesophageal reflux disease)    Headache    Herpes genitalis    History of abnormal Pap smear    Hypertension    IBS (irritable bowel syndrome)    Migraine    Osteoarthritis    Ovarian cyst    Recurrent sinusitis     Surgical History: Past Surgical History:  Procedure Laterality Date   ABDOMINAL HYSTERECTOMY  2005   bilateral carpal tunnel release  2006   CERVICAL CONE BIOPSY  1986   COLONOSCOPY WITH PROPOFOL N/A 06/23/2016   Procedure: COLONOSCOPY WITH PROPOFOL;  Surgeon: Lollie Sails, MD;  Location: [facility] ENDOSCOPY;  Service: Endoscopy;  Laterality: N/A;   ENDOMETRIAL ABLATION  2001   ESOPHAGOGASTRODUODENOSCOPY (EGD) WITH PROPOFOL N/A 06/23/2016   Procedure: ESOPHAGOGASTRODUODENOSCOPY (EGD) WITH PROPOFOL;  Surgeon: Lollie Sails, MD;  Location: [facility] ENDOSCOPY;  Service: Endoscopy;  Laterality: N/A;   RHINOPLASTY  2004   TONSILLECTOMY  1965   TOTAL ABDOMINAL HYSTERECTOMY  2002   LSO has right ovary and tube, MAD   TUBAL LIGATION      Home Medications:  Allergies as of 12/17/2020       Reactions   Codeine    Ace Inhibitors Rash   Hydrocodone Bit-homatrop Mbr Nausea Only, Nausea And Vomiting        Medication List        Accurate as of December 17, 2020  8:58 AM. If you have any questions, ask your nurse or doctor.          albuterol 108 (90 Base) MCG/ACT inhaler Commonly known as: VENTOLIN HFA Inhale into the lungs.   aluminum hydroxide-magnesium carbonate 95-358 MG/15ML Susp Commonly known as: GAVISCON Take by mouth.   amLODipine 2.5 MG tablet Commonly known as: NORVASC Take 2.5 mg by mouth daily.   cholecalciferol 10 MCG (400 UNIT) Tabs tablet Commonly known as: VITAMIN D3 Take 1,000  Units by mouth.   clotrimazole-betamethasone cream Commonly known as: LOTRISONE Apply externally BID prn sx up to 2 wks   fluticasone 50 MCG/ACT nasal spray Commonly known as: FLONASE Place 2 sprays into the nose daily.   metoprolol succinate 25 MG 24 hr tablet Commonly known as: TOPROL-XL TK 1/2 T PO QD   Pfizer COVID-19 Vac Bivalent injection Generic drug: COVID-19 mRNA bivalent vaccine (Pfizer) Inject into the muscle.   Pfizer-BioNTech COVID-19 Vacc 30 MCG/0.3ML injection Generic drug: COVID-19 mRNA vaccine (Pfizer) USE AS DIRECTED   phenazopyridine 200 MG tablet Commonly known as: Pyridium Take 1 tablet (200 mg total) by mouth 3 (three) times daily as needed for pain (urethral spasm).   potassium chloride 10 MEQ tablet Commonly  known as: KLOR-CON Take 10 mEq by mouth daily.   RABEprazole 20 MG tablet Commonly known as: ACIPHEX Take 20 mg by mouth daily.   sodium chloride 0.65 % nasal spray Commonly known as: OCEAN Place into the nose.   terbinafine 250 MG tablet Commonly known as: LAMISIL TAKE 1 TABLET BY MOUTH FOR 7 DAYS AT THE BEGINNING OF EACH MONTH FOR MONTHS 1,3,5,7   triamterene-hydrochlorothiazide 37.5-25 MG tablet Commonly known as: MAXZIDE-25 1/2 tablet q day   umeclidinium-vilanterol 62.5-25 MCG/INH Aepb Commonly known as: ANORO ELLIPTA Inhale into the lungs.   Uribel 118 MG Caps Take 1 capsule (118 mg total) by mouth 4 (four) times daily as needed.   vitamin B-12 1000 MCG tablet Commonly known as: CYANOCOBALAMIN Take by mouth.        Allergies:  Allergies  Allergen Reactions   Codeine    Ace Inhibitors Rash   Hydrocodone Bit-Homatrop Mbr Nausea Only and Nausea And Vomiting    Family History: Family History  Problem Relation Age of Onset   Hypertension Mother    Endometriosis Sister    Asthma Brother    Breast cancer Maternal Aunt        70's, tested and treated, has contact w her   Diabetes Father        DM Type 2   Hypertension Father    Prostate cancer Father    Other Father        Pre Leukemia   Colon cancer Maternal Grandfather 72   Uterine cancer Maternal Aunt 63   Prostate cancer Maternal Uncle        no mets   Prostate cancer Maternal Uncle        no mets   Leukemia Maternal Grandmother     Social History:  reports that she quit smoking about 25 years ago. She has never used smokeless tobacco. She reports that she does not drink alcohol and does not use drugs.  ROS:                                        Physical Exam: There were no vitals taken for this visit.  Constitutional:  Alert and oriented, No acute distress. HEENT: [place] AT, moist mucus membranes.  Trachea midline, no masses. Cardiovascular: No clubbing, cyanosis, or  edema. Respiratory: Normal respiratory effort, no increased work of breathing. GI: Abdomen is soft, nontender, nondistended, no abdominal masses GU: Grade 2 hypermobility the bladder neck.  Small asymptomatic cystocele. Skin: No rashes, bruises or suspicious lesions. Lymph: No cervical or inguinal adenopathy. Neurologic: Grossly intact, no focal deficits, moving all 4 extremities. Psychiatric: Normal mood and affect.  Laboratory  Data: No results found for: WBC, HGB, HCT, MCV, PLT  No results found for: CREATININE  No results found for: PSA  No results found for: TESTOSTERONE  No results found for: HGBA1C  Urinalysis    Component Value Date/Time   APPEARANCEUR Clear 11/30/2020 1650   GLUCOSEU Negative 11/30/2020 1650   BILIRUBINUR neg 11/30/2020 1703   BILIRUBINUR Negative 11/30/2020 1650   PROTEINUR Negative 11/30/2020 1703   PROTEINUR Negative 11/30/2020 1650   NITRITE neg 11/30/2020 1703   NITRITE Negative 11/30/2020 1650   LEUKOCYTESUR Negative 11/30/2020 1703   LEUKOCYTESUR Negative 11/30/2020 1650    Pertinent Imaging: Urine reviewed.  Urine sent for culture.  Chart reviewed  Assessment & Plan: Patient likely had a bladder infection and now has up regulation of a mild overactive bladder that should normalize over time.  I do not think she will need medication for this short-term but it can be offered and I mentioned this..  I recommended work-up for her hematuria with repeat CT scan and return for cystoscopy.  Call if urine culture is positive.  Patient understands that hitting her back against a mirror likely did not cause left flank pain or hematuria  There are no diagnoses linked to this encounter.  No follow-ups on file.  Reece Packer, MD  [facility] [address] [place], [place] [address] [phone]

## 2020-12-19 LAB — URINALYSIS, COMPLETE
Bilirubin, UA: NEGATIVE
Glucose, UA: NEGATIVE
Ketones, UA: NEGATIVE
Leukocytes,UA: NEGATIVE
Nitrite, UA: NEGATIVE
Protein,UA: NEGATIVE
RBC, UA: NEGATIVE
Specific Gravity, UA: 1.025 (ref 1.005–1.030)
Urobilinogen, Ur: 0.2 mg/dL (ref 0.2–1.0)
pH, UA: 7 (ref 5.0–7.5)

## 2020-12-19 LAB — MICROSCOPIC EXAMINATION: Bacteria, UA: NONE SEEN

## 2020-12-20 ENCOUNTER — Telehealth: Payer: Self-pay

## 2020-12-20 MED ORDER — CIPROFLOXACIN HCL 250 MG PO TABS: 250.0000 mg | ORAL_TABLET | Freq: Two times a day (BID) | ORAL | 0 refills | Status: AC

## 2020-12-20 NOTE — Telephone Encounter (Signed)
After speaking with patient and calling in RX. Pt calls back and is nervous about taking cipro she states she googled and it states it is a high risk medication and a last resort and wants to know if she can take something else.

## 2020-12-20 NOTE — Telephone Encounter (Signed)
Pt aware - Medication sent to pharmacy.  

## 2020-12-20 NOTE — Telephone Encounter (Signed)
-----   Message from Bjorn Loser, MD sent at 12/20/2020 11:15 AM EDT ----- Cipro 250 mg bid for 7 days   ----- Message ----- From: Alvera Novel, CMA Sent: 12/20/2020   9:40 AM EDT To: Bjorn Loser, MD   ----- Message ----- From: Interface, [facility] Lab Results In Sent: 12/19/2020   7:37 AM EDT To: [facility]

## 2020-12-21 LAB — CULTURE, URINE COMPREHENSIVE

## 2020-12-21 NOTE — Telephone Encounter (Signed)
Spoke with patient again and advised that message was sent to [place].  I also advised pt that cipro was fine to take, pt states she will try it.

## 2020-12-24 NOTE — Telephone Encounter (Signed)
Pt called back stating she did not start cipro. I advised patient that cipro is safe to take. Pt states she will try it.

## 2021-01-14 ENCOUNTER — Other Ambulatory Visit: Payer: Managed Care, Other (non HMO) | Admitting: Urology

## 2021-01-23 ENCOUNTER — Ambulatory Visit
Admission: RE | Admit: 2021-01-23 | Discharge: 2021-01-23 | Disposition: A | Discharge status: Home / Self Care | Payer: Managed Care, Other (non HMO) | Source: Ambulatory Visit | Attending: Urology | Admitting: Urology

## 2021-01-23 ENCOUNTER — Other Ambulatory Visit: Payer: Self-pay

## 2021-01-23 DIAGNOSIS — R319 Hematuria, unspecified: Secondary | ICD-10-CM | POA: Insufficient documentation

## 2021-01-23 LAB — POCT I-STAT CREATININE: Creatinine, Ser: 0.7 mg/dL (ref 0.44–1.00)

## 2021-01-23 MED ORDER — IOHEXOL 350 MG/ML SOLN
100.0000 mL | Freq: Once | INTRAVENOUS | Status: AC | PRN
  Administered 2021-01-23: 100 mL via INTRAVENOUS

## 2021-02-04 ENCOUNTER — Ambulatory Visit: Payer: Managed Care, Other (non HMO) | Admitting: Urology

## 2021-02-04 ENCOUNTER — Other Ambulatory Visit: Payer: Self-pay

## 2021-02-04 ENCOUNTER — Encounter: Payer: Self-pay | Admitting: Urology

## 2021-02-04 VITALS — BP 127/63 | HR 59

## 2021-02-04 DIAGNOSIS — R310 Gross hematuria: Secondary | ICD-10-CM

## 2021-02-04 LAB — MICROSCOPIC EXAMINATION
Bacteria, UA: NONE SEEN
WBC, UA: NONE SEEN /hpf (ref 0–5)

## 2021-02-04 LAB — URINALYSIS, COMPLETE
Bilirubin, UA: NEGATIVE
Glucose, UA: NEGATIVE
Ketones, UA: NEGATIVE
Leukocytes,UA: NEGATIVE
Nitrite, UA: NEGATIVE
Protein,UA: NEGATIVE
Specific Gravity, UA: >1.03 — ABNORMAL HIGH (ref 1.005–1.030)
Urobilinogen, Ur: 0.2 mg/dL (ref 0.2–1.0)
pH, UA: 5.5 (ref 5.0–7.5)

## 2021-02-04 NOTE — Progress Notes (Signed)
02/04/2021 2:33 PM   Debra Dean Feb 01, 1961 [id number]  Referring provider: [name], MD [facility] [place],  [place] [address]  No chief complaint on file.   HPI: I was consulted to assess the patient's bladder infections.  In August she had blood in urine and left flank pain.  She was having pain with urination.  She describes taking antibiotics and her symptoms cleared but may have returned.  She had a second and perhaps a third antibiotic and then probably a negative culture.  She is almost back to normal but appears that her bladder is currently more frequent than it used to be.  She is voiding every 1-2 hours and her bladder is more urgent but she is not leaking.  She is now getting up once a night.  She was wearing liners at that time for protection but no longer is  Has had a hysterectomy.  Gets infrequent bladder infections  She had a normal CT stone protocol June 2021.  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  Used to smoke.  No daily aspirin or blood thinner    Patient likely had a bladder infection and now has up regulation of a mild overactive bladder that should normalize over time.  I do not think she will need medication for this short-term but it can be offered and I mentioned this..  I recommended work-up for her hematuria with repeat CT scan and return for cystoscopy.  Call if urine culture is positive.  Patient understands that hitting her back against a mirror likely did not cause left flank pain or hematuria  Today Frequency stable.  CT scan with contrast normal.  Last urine culture positive Urgency and frequency definitely settling.  Still gets up once a night sometimes.  No blood in urine  Cystoscopy: Patient underwent flexible cystoscopy.  Bladder mucosa and trigone were normal.  No stitch foreign body or carcinoma.  No cystitis    PMH: Past Medical History:  Diagnosis Date    Anginal pain ([place])    Carpal tunnel syndrome    mild-mod left, mild right   Central sleep apnea    Dyspnea    had stress test and negative having another sleep study and MRI of brain   GERD (gastroesophageal reflux disease)    Headache    Herpes genitalis    History of abnormal Pap smear    Hypertension    IBS (irritable bowel syndrome)    Migraine    Osteoarthritis    Ovarian cyst    Recurrent sinusitis     Surgical History: Past Surgical History:  Procedure Laterality Date   ABDOMINAL HYSTERECTOMY  2005   bilateral carpal tunnel release  2006   CERVICAL CONE BIOPSY  1986   COLONOSCOPY WITH PROPOFOL N/A 06/23/2016   Procedure: COLONOSCOPY WITH PROPOFOL;  Surgeon: Lollie Sails, MD;  Location: [facility] ENDOSCOPY;  Service: Endoscopy;  Laterality: N/A;   ENDOMETRIAL ABLATION  2001   ESOPHAGOGASTRODUODENOSCOPY (EGD) WITH PROPOFOL N/A 06/23/2016   Procedure: ESOPHAGOGASTRODUODENOSCOPY (EGD) WITH PROPOFOL;  Surgeon: Lollie Sails, MD;  Location: [facility] ENDOSCOPY;  Service: Endoscopy;  Laterality: N/A;   RHINOPLASTY  2004   TONSILLECTOMY  1965   TOTAL ABDOMINAL HYSTERECTOMY  2002   LSO has right ovary and tube, MAD   TUBAL LIGATION      Home Medications:  Allergies as of 02/04/2021  Reactions   Codeine    Ace Inhibitors Rash   Hydrocodone Bit-homatrop Mbr Nausea Only, Nausea And Vomiting        Medication List        Accurate as of February 04, 2021  2:33 PM. If you have any questions, ask your nurse or doctor.          albuterol 108 (90 Base) MCG/ACT inhaler Commonly known as: VENTOLIN HFA Inhale into the lungs.   aluminum hydroxide-magnesium carbonate 95-358 MG/15ML Susp Commonly known as: GAVISCON Take by mouth.   amLODipine 2.5 MG tablet Commonly known as: NORVASC Take 2.5 mg by mouth daily.   cholecalciferol 10 MCG (400 UNIT) Tabs tablet Commonly known as: VITAMIN D3 Take 1,000 Units by mouth.   clotrimazole-betamethasone  cream Commonly known as: LOTRISONE Apply externally BID prn sx up to 2 wks   fluticasone 50 MCG/ACT nasal spray Commonly known as: FLONASE Place 2 sprays into the nose daily.   metoprolol succinate 25 MG 24 hr tablet Commonly known as: TOPROL-XL TK 1/2 T PO QD   Pfizer COVID-19 Vac Bivalent injection Generic drug: COVID-19 mRNA bivalent vaccine (Pfizer) Inject into the muscle.   potassium chloride 10 MEQ tablet Commonly known as: KLOR-CON Take 10 mEq by mouth daily.   RABEprazole 20 MG tablet Commonly known as: ACIPHEX Take 20 mg by mouth daily.   sodium chloride 0.65 % nasal spray Commonly known as: OCEAN Place into the nose.   terbinafine 250 MG tablet Commonly known as: LAMISIL TAKE 1 TABLET BY MOUTH FOR 7 DAYS AT THE BEGINNING OF EACH MONTH FOR MONTHS 1,3,5,7   triamcinolone cream 0.5 % Commonly known as: KENALOG Apply topically 2 (two) times daily.   triamterene-hydrochlorothiazide 37.5-25 MG tablet Commonly known as: MAXZIDE-25 1/2 tablet q day   umeclidinium-vilanterol 62.5-25 MCG/INH Aepb Commonly known as: ANORO ELLIPTA Inhale into the lungs.   vitamin B-12 1000 MCG tablet Commonly known as: CYANOCOBALAMIN Take by mouth.        Allergies:  Allergies  Allergen Reactions   Codeine    Ace Inhibitors Rash   Hydrocodone Bit-Homatrop Mbr Nausea Only and Nausea And Vomiting    Family History: Family History  Problem Relation Age of Onset   Hypertension Mother    Endometriosis Sister    Asthma Brother    Breast cancer Maternal Aunt        70's, tested and treated, has contact w her   Diabetes Father        DM Type 2   Hypertension Father    Prostate cancer Father    Other Father        Pre Leukemia   Colon cancer Maternal Grandfather 78   Uterine cancer Maternal Aunt 63   Prostate cancer Maternal Uncle        no mets   Prostate cancer Maternal Uncle        no mets   Leukemia Maternal Grandmother     Social History:  reports that she  quit smoking about 25 years ago. She has never used smokeless tobacco. She reports that she does not drink alcohol and does not use drugs.  ROS:                                        Physical Exam: There were no vitals taken for this visit.  Constitutional:  Alert and oriented, No acute  distress. HEENT: [place] AT, moist mucus membranes.  Trachea midline, no masses.  Laboratory Data: No results found for: WBC, HGB, HCT, MCV, PLT  Lab Results  Component Value Date   CREATININE 0.70 01/23/2021    No results found for: PSA  No results found for: TESTOSTERONE  No results found for: HGBA1C  Urinalysis    Component Value Date/Time   APPEARANCEUR Clear 12/17/2020 0920   GLUCOSEU Negative 12/17/2020 0920   BILIRUBINUR Negative 12/17/2020 0920   PROTEINUR Negative 12/17/2020 0920   NITRITE Negative 12/17/2020 0920   LEUKOCYTESUR Negative 12/17/2020 0920    Pertinent Imaging:   Assessment & Plan: Patient has been cleared for hematuria.  We will see her as needed.  Pathophysiology described.  I did not put her on prophylaxis.  This was briefly discussed  1. Gross hematuria  - Urinalysis, Complete   No follow-ups on file.  Debra Packer, MD  [facility] [address], [place], [place] [address] [phone]

## 2021-02-16 ENCOUNTER — Other Ambulatory Visit: Payer: Self-pay | Admitting: Dermatology

## 2021-02-19 NOTE — Telephone Encounter (Signed)
Yes, continue until follow-up unless nails completely clear. Thanks!

## 2021-04-01 ENCOUNTER — Telehealth: Payer: Self-pay

## 2021-04-01 NOTE — Telephone Encounter (Signed)
Called pt informed her that provider agrees.

## 2021-04-01 NOTE — Telephone Encounter (Signed)
Incoming call from pt who states she was seen by her PCP for a UTI, that office contacted her today to inform her that she does have a UTI and they prescribed Macrobid. She wants you to confirm that this is the most appropriate antibiotic. (See information below) I advised pt to begin Macrobid based on sensitivities below. Please advise on additional recommendations if any.     For Enterococcus species, aminoglycosides (except for high-level  resistance screening), cephalosporins, clindamycin, and  trimethoprim-sulfamethoxazole are not effective clinically.  (CLSI, M100-S26, 2016)  25,000-50,000 colony forming units per mL    Antimicrobial Susceptibility - [facility]  Comment        ** S = Susceptible; I = Intermediate; R = Resistant **                     P = Positive; N = Negative              MICS are expressed in micrograms per mL     Antibiotic                 RSLT#1    RSLT#2    RSLT#3    RSLT#4  Ciprofloxacin                  S  Levofloxacin                   S  Nitrofurantoin                 S  Penicillin                     S  Tetracycline                   R  Vancomycin                     S

## 2021-04-10 ENCOUNTER — Other Ambulatory Visit: Payer: Self-pay

## 2021-04-10 ENCOUNTER — Telehealth: Payer: Self-pay

## 2021-04-10 MED ORDER — FLUCONAZOLE 200 MG PO TABS: ORAL_TABLET | ORAL | 0 refills | Status: DC

## 2021-04-10 NOTE — Telephone Encounter (Signed)
Ok to send 4 more tablets for the next month until f/u. Thanks!

## 2021-04-10 NOTE — Telephone Encounter (Signed)
Pt called requesting a refill of Diflucan 200 mg tablets.

## 2021-05-06 ENCOUNTER — Encounter: Payer: Self-pay | Admitting: Urology

## 2021-05-06 ENCOUNTER — Ambulatory Visit: Payer: Managed Care, Other (non HMO) | Admitting: Urology

## 2021-05-06 ENCOUNTER — Other Ambulatory Visit: Payer: Self-pay

## 2021-05-06 VITALS — BP 145/74 | HR 64 | Ht 62.0 in | Wt 144.0 lb

## 2021-05-06 DIAGNOSIS — N3020 Other chronic cystitis without hematuria: Secondary | ICD-10-CM | POA: Diagnosis not present

## 2021-05-06 DIAGNOSIS — R3915 Urgency of urination: Secondary | ICD-10-CM | POA: Diagnosis not present

## 2021-05-06 MED ORDER — TRIMETHOPRIM 100 MG PO TABS: 100.0000 mg | ORAL_TABLET | Freq: Every day | ORAL | 11 refills | Status: DC

## 2021-05-06 NOTE — Progress Notes (Signed)
? ?05/06/2021 ?9:51 AM  ? ?Debra Dean ?08-01-1960 ?[id number] ? ?Referring provider: [name], MD ?[place] ?[facility] ?[place],  [place] [address] ? ?Chief Complaint  ?Patient presents with  ? Recurrent UTI  ? ? ?HPI: ?I was consulted to assess the patient's bladder infections.  In August she had blood in urine and left flank pain.  She was having pain with urination.  She describes taking antibiotics and her symptoms cleared but may have returned.  She had a second and perhaps a third antibiotic and then probably a negative culture.  She is almost back to normal but appears that her bladder is currently more frequent than it used to be.  She is voiding every 1-2 hours and her bladder is more urgent but she is not leaking.  She is now getting up once a night.  She was wearing liners at that time for protection but no longer is ? ?Has had a hysterectomy.  Gets infrequent bladder infections ? ?She had a normal CT stone protocol June 2021.  She had a negative urine culture November 30, 2020 ?  ?Works in the [employer].  Used to smoke.  No daily aspirin or blood thinner   ?  ?Patient likely had a bladder infection and now has up regulation of a mild overactive bladder that should normalize over time.   ?  ?CT scan with contrast and cysto normal.  Last urine culture positive ?Urgency and frequency definitely settling.  Still gets up once a night sometimes.  No blood in urine ? ?Patient has been cleared for hematuria.  We will see her as needed.  Pathophysiology described.  I did not put her on prophylaxis.  This was briefly discussed ? ?Today ?Patient was treated for urinary tract infection in February after being treated for a sinus infection with prednisone and antibiotics.  They gave her Macrodantin followed by Augmentin.  Her symptoms came back in March and now she is on a half a tablet of Bactrim daily.  Now has some vague back pain on the left side again but no other cystitis  symptoms.  The last culture I see on the chart was in October and it was positive ? ?Frequency stable ? ? ? ?PMH: ?Past Medical History:  ?Diagnosis Date  ? Anginal pain ([place])   ? Carpal tunnel syndrome   ? mild-mod left, mild right  ? Central sleep apnea   ? Dyspnea   ? had stress test and negative having another sleep study and MRI of brain  ? GERD (gastroesophageal reflux disease)   ? Headache   ? Herpes genitalis   ? History of abnormal Pap smear   ? Hypertension   ? IBS (irritable bowel syndrome)   ? Migraine   ? Osteoarthritis   ? Ovarian cyst   ? Recurrent sinusitis   ? ? ?Surgical History: ?Past Surgical History:  ?Procedure Laterality Date  ? ABDOMINAL HYSTERECTOMY  2005  ? bilateral carpal tunnel release  2006  ? CERVICAL CONE BIOPSY  1986  ? COLONOSCOPY WITH PROPOFOL N/A 06/23/2016  ? Procedure: COLONOSCOPY WITH PROPOFOL;  Surgeon: Lollie Sails, MD;  Location: [facility] ENDOSCOPY;  Service: Endoscopy;  Laterality: N/A;  ? ENDOMETRIAL ABLATION  2001  ? ESOPHAGOGASTRODUODENOSCOPY (EGD) WITH PROPOFOL N/A 06/23/2016  ? Procedure: ESOPHAGOGASTRODUODENOSCOPY (EGD) WITH PROPOFOL;  Surgeon: Lollie Sails, MD;  Location: [facility] ENDOSCOPY;  Service: Endoscopy;  Laterality: N/A;  ? RHINOPLASTY  2004  ? TONSILLECTOMY  1965  ?  TOTAL ABDOMINAL HYSTERECTOMY  2002  ? LSO has right ovary and tube, MAD  ? TUBAL LIGATION    ? ? ?Home Medications:  ?Allergies as of 05/06/2021   ? ?   Reactions  ? Codeine   ? Nitrofurantoin Other (See Comments)  ? Shortness of breath  ? Ace Inhibitors Rash  ? Hydrocodone Bit-homatrop Mbr Nausea Only, Nausea And Vomiting  ? ?  ? ?  ?Medication List  ?  ? ?  ? Accurate as of May 06, 2021  9:51 AM. If you have any questions, ask your nurse or doctor.  ?  ?  ? ?  ? ?albuterol 108 (90 Base) MCG/ACT inhaler ?Commonly known as: VENTOLIN HFA ?Inhale into the lungs. ?  ?aluminum hydroxide-magnesium carbonate 95-358 MG/15ML Susp ?Commonly known as: GAVISCON ?Take by mouth. ?  ?amLODipine 2.5 MG  tablet ?Commonly known as: NORVASC ?Take 2.5 mg by mouth daily. ?  ?cholecalciferol 10 MCG (400 UNIT) Tabs tablet ?Commonly known as: VITAMIN D3 ?Take 1,000 Units by mouth. ?  ?clotrimazole-betamethasone cream ?Commonly known as: LOTRISONE ?Apply externally BID prn sx up to 2 wks ?  ?fluconazole 200 MG tablet ?Commonly known as: DIFLUCAN ?TAKE 1 TABLET BY MOUTH ONCE WEEKLY AS DIRECTED ?  ?fluticasone 50 MCG/ACT nasal spray ?Commonly known as: FLONASE ?Place 2 sprays into the nose daily. ?  ?metoprolol succinate 25 MG 24 hr tablet ?Commonly known as: TOPROL-XL ?TK 1/2 T PO QD ?  ?Pfizer COVID-19 Vac Bivalent injection ?Generic drug: COVID-19 mRNA bivalent vaccine [employer]) ?Inject into the muscle. ?  ?potassium chloride 10 MEQ tablet ?Commonly known as: KLOR-CON ?Take 10 mEq by mouth daily. ?  ?RABEprazole 20 MG tablet ?Commonly known as: ACIPHEX ?Take 20 mg by mouth daily. ?  ?sodium chloride 0.65 % nasal spray ?Commonly known as: OCEAN ?Place into the nose. ?  ?sulfamethoxazole-trimethoprim 400-80 MG tablet ?Commonly known as: BACTRIM ?Take by mouth. ?  ?triamcinolone cream 0.5 % ?Commonly known as: KENALOG ?Apply topically 2 (two) times daily. ?  ?triamterene-hydrochlorothiazide 37.5-25 MG tablet ?Commonly known as: MAXZIDE-25 ?1/2 tablet q day ?  ?umeclidinium-vilanterol 62.5-25 MCG/INH Aepb ?Commonly known as: ANORO ELLIPTA ?Inhale into the lungs. ?  ?vitamin B-12 1000 MCG tablet ?Commonly known as: CYANOCOBALAMIN ?Take by mouth. ?  ? ?  ? ? ?Allergies:  ?Allergies  ?Allergen Reactions  ? Codeine   ? Nitrofurantoin Other (See Comments)  ?  Shortness of breath  ? Ace Inhibitors Rash  ? Hydrocodone Bit-Homatrop Mbr Nausea Only and Nausea And Vomiting  ? ? ?Family History: ?Family History  ?Problem Relation Age of Onset  ? Hypertension Mother   ? Kidney disease Mother   ? Diabetes Father   ?     DM Type 2  ? Hypertension Father   ? Prostate cancer Father   ? Other Father   ?     Pre Leukemia  ? Endometriosis Sister    ? Asthma Brother   ? Leukemia Maternal Grandmother   ? Colon cancer Maternal Grandfather 63  ? Breast cancer Maternal Aunt   ?     70's, tested and treated, has contact w her  ? Uterine cancer Maternal Aunt 63  ? Prostate cancer Maternal Uncle   ?     no mets  ? Prostate cancer Maternal Uncle   ?     no mets  ? ? ?Social History:  reports that she quit smoking about 26 years ago. Her smoking use included cigarettes. She has never  used smokeless tobacco. She reports that she does not drink alcohol and does not use drugs. ? ?ROS: ?  ? ?  ? ?  ? ?  ? ?  ? ?  ? ?  ? ?  ? ?  ? ?  ? ?  ? ?  ? ?  ? ?Physical Exam: ?BP (!) 145/74   Pulse 64   Ht '5\' 2"'$  (1.575 m)   Wt 65.3 kg   BMI 26.34 kg/m?   ?Constitutional:  Alert and oriented, No acute distress. ?HEENT: [place] AT, moist mucus membranes.  Trachea midline, no masses. ? ? ?Laboratory Data: ?No results found for: WBC, HGB, HCT, MCV, PLT ? ?Lab Results  ?Component Value Date  ? CREATININE 0.70 01/23/2021  ? ? ?No results found for: PSA ? ?No results found for: TESTOSTERONE ? ?No results found for: HGBA1C ? ?Urinalysis ?   ?Component Value Date/Time  ? APPEARANCEUR Clear 02/04/2021 1438  ? GLUCOSEU Negative 02/04/2021 1438  ? BILIRUBINUR Negative 02/04/2021 1438  ? PROTEINUR Negative 02/04/2021 1438  ? NITRITE Negative 02/04/2021 1438  ? LEUKOCYTESUR Negative 02/04/2021 1438  ? ? ?Pertinent Imaging: ? ? ?Assessment & Plan: Patient understands that kidneys are normal relative to back pain.  Send urine for culture.  Patient may have gotten short of breath on Macrodantin.  I do not think she is sleepy from the sulfa drug.  She is going to the beach and does not want to be sun sensitive.  I switch her to trimethoprim 100 mg 3x11.  Make sure cultures are continuing to be sent.  It does sound like she had a positive culture recently by primary care.  Reassess in 4 months ? ?1. Urinary urgency ? ?- Urinalysis, Complete ? ? ?No follow-ups on file. ? ?Reece Packer,  MD ? ?[place] ?[address], [address] ?[place], [place] [address] ?[phone] ?  ?

## 2021-05-07 LAB — URINALYSIS, COMPLETE
Bilirubin, UA: NEGATIVE
Glucose, UA: NEGATIVE
Ketones, UA: NEGATIVE
Leukocytes,UA: NEGATIVE
Nitrite, UA: NEGATIVE
Protein,UA: NEGATIVE
RBC, UA: NEGATIVE
Specific Gravity, UA: 1.015 (ref 1.005–1.030)
Urobilinogen, Ur: 0.2 mg/dL (ref 0.2–1.0)
pH, UA: 5.5 (ref 5.0–7.5)

## 2021-05-07 LAB — MICROSCOPIC EXAMINATION: Bacteria, UA: NONE SEEN

## 2021-05-09 ENCOUNTER — Ambulatory Visit: Payer: Managed Care, Other (non HMO) | Admitting: Dermatology

## 2021-05-13 ENCOUNTER — Encounter: Payer: Self-pay | Admitting: Obstetrics and Gynecology

## 2021-05-15 ENCOUNTER — Other Ambulatory Visit: Payer: Self-pay

## 2021-05-15 ENCOUNTER — Encounter: Payer: Self-pay | Admitting: Obstetrics and Gynecology

## 2021-05-15 ENCOUNTER — Ambulatory Visit: Payer: Managed Care, Other (non HMO) | Admitting: Obstetrics and Gynecology

## 2021-05-15 VITALS — BP 126/70 | Ht 62.0 in | Wt 145.0 lb

## 2021-05-15 DIAGNOSIS — N898 Other specified noninflammatory disorders of vagina: Secondary | ICD-10-CM | POA: Diagnosis not present

## 2021-05-15 DIAGNOSIS — Z87440 Personal history of urinary (tract) infections: Secondary | ICD-10-CM | POA: Diagnosis not present

## 2021-05-15 LAB — POCT URINALYSIS DIPSTICK
Bilirubin, UA: NEGATIVE
Blood, UA: NEGATIVE
Glucose, UA: NEGATIVE
Ketones, UA: NEGATIVE
Leukocytes, UA: NEGATIVE
Nitrite, UA: NEGATIVE
Protein, UA: NEGATIVE
Spec Grav, UA: 1.02 (ref 1.010–1.025)
pH, UA: 5 (ref 5.0–8.0)

## 2021-05-15 NOTE — Progress Notes (Signed)
? ? ?[name], MD ? ? ?Chief Complaint  ?Patient presents with  ? Urinary Tract Infection  ?  Urgency and burning urinating since Sat  ? Vaginal Irritation  ?  No discharge, itchiness or odor  ? ? ?HPI: ?     [name] is a 61 y.o. G1P1 whose LMP was No LMP recorded. Patient has had a hysterectomy., presents today for vaginal pain initially, then itching, on LT labia minora for 5 days. No increased d/c or odor. Pt on several rounds abx for sinusitis and UTIs in Feb and March. Already takes diflucan wkly for onychomycosis but sx persisted. Pt tried clotrimazole with some improvement. 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 Husband with hx of HSV 1/cold sores.  ?Pt also with urinary frequency with good flow and dysuria for about 5 days, although sx improving. Hx of UTIs with several abx tx. Saw urology last wk with neg UA, no C&S done since UA neg.  ? ?Patient Active Problem List  ? Diagnosis Date Noted  ? External hemorrhoid 03/29/2018  ? Vaginal dryness, menopausal 03/29/2018  ? Hypertension 02/24/2012  ? GERD (gastroesophageal reflux disease) 02/24/2012  ? ? ?Past Surgical History:  ?Procedure Laterality Date  ? ABDOMINAL HYSTERECTOMY  2005  ? bilateral carpal tunnel release  2006  ? CERVICAL CONE BIOPSY  1986  ? COLONOSCOPY WITH PROPOFOL N/A 06/23/2016  ? Procedure: COLONOSCOPY WITH PROPOFOL;  Surgeon: Lollie Sails, MD;  [facility] ENDOSCOPY;  Service: Endoscopy;  Laterality: N/A;  ? ENDOMETRIAL ABLATION  2001  ? ESOPHAGOGASTRODUODENOSCOPY (EGD) WITH PROPOFOL N/A 06/23/2016  ? Procedure: ESOPHAGOGASTRODUODENOSCOPY (EGD) WITH PROPOFOL;  Surgeon: Lollie Sails, MD;  Location: [facility] ENDOSCOPY;  Service: Endoscopy;  Laterality: N/A;  ? RHINOPLASTY  2004  ? TONSILLECTOMY  1965  ? TOTAL ABDOMINAL HYSTERECTOMY  2002  ? LSO has right ovary and tube, MAD  ? TUBAL LIGATION    ? ? ?Family History  ?Problem Relation Age of Onset  ? Hypertension Mother   ?  Kidney disease Mother   ? Diabetes Father   ?     DM Type 2  ? Hypertension Father   ? Prostate cancer Father   ? Other Father   ?     Pre Leukemia  ? Endometriosis Sister   ? Asthma Brother   ? Leukemia Maternal Grandmother   ? Colon cancer Maternal Grandfather 50  ? Breast cancer Maternal Aunt   ?     70's, tested and treated, has contact w her  ? Uterine cancer Maternal Aunt 63  ? Prostate cancer Maternal Uncle   ?     no mets  ? Prostate cancer Maternal Uncle   ?     no mets  ? ? ?Social History  ? ?Socioeconomic History  ? Marital status: Married  ?  Spouse name: Not on file  ? Number of children: 1  ? Years of education: Not on file  ? Highest education level: Not on file  ?Occupational History  ? Not on file  ?Tobacco Use  ? Smoking status: Former  ?  Types: Cigarettes  ?  Quit date: 02/18/1995  ?  Years since quitting: 26.2  ? Smokeless tobacco: Never  ?Vaping Use  ? Vaping Use: Never used  ?Substance and Sexual Activity  ? Alcohol use: No  ? Drug use: No  ? Sexual activity: Yes  ?  Birth control/protection:  Surgical  ?  Comment: Hysterectomy  ?Other Topics Concern  ? Not on file  ?Social History Narrative  ? Not on file  ? ?Social Determinants of Health  ? ?Financial Resource Strain: Not on file  ?Food Insecurity: Not on file  ?Transportation Needs: Not on file  ?Physical Activity: Not on file  ?Stress: Not on file  ?Social Connections: Not on file  ?Intimate Partner Violence: Not on file  ? ? ?Outpatient Medications Prior to Visit  ?Medication Sig Dispense Refill  ? albuterol (PROVENTIL HFA;VENTOLIN HFA) 108 (90 Base) MCG/ACT inhaler Inhale into the lungs.    ? aluminum hydroxide-magnesium carbonate (GAVISCON) 95-358 MG/15ML SUSP Take by mouth.    ? amLODipine (NORVASC) 2.5 MG tablet Take 2.5 mg by mouth daily.    ? cholecalciferol (VITAMIN D) 400 units TABS tablet Take 1,000 Units by mouth.    ? fluconazole (DIFLUCAN) 200 MG tablet TAKE 1 TABLET BY MOUTH ONCE WEEKLY AS DIRECTED 4 tablet 0  ? fluticasone  (FLONASE) 50 MCG/ACT nasal spray Place 2 sprays into the nose daily.    ? metoprolol succinate (TOPROL-XL) 25 MG 24 hr tablet TK 1/2 T PO QD    ? potassium chloride (K-DUR) 10 MEQ tablet Take 10 mEq by mouth daily.    ? RABEprazole (ACIPHEX) 20 MG tablet Take 20 mg by mouth daily.    ? sodium chloride (OCEAN) 0.65 % nasal spray Place into the nose.    ? triamterene-hydrochlorothiazide (MAXZIDE-25) 37.5-25 MG per tablet 1/2 tablet q day    ? umeclidinium-vilanterol (ANORO ELLIPTA) 62.5-25 MCG/INH AEPB Inhale into the lungs.    ? vitamin B-12 (CYANOCOBALAMIN) 1000 MCG tablet Take by mouth.    ? clotrimazole-betamethasone (LOTRISONE) cream Apply externally BID prn sx up to 2 wks (Patient not taking: Reported on 05/15/2021) 15 g 1  ? triamcinolone cream (KENALOG) 0.5 % Apply topically 2 (two) times daily. (Patient not taking: Reported on 05/15/2021)    ? trimethoprim (TRIMPEX) 100 MG tablet Take 1 tablet (100 mg total) by mouth daily. (Patient not taking: Reported on 05/15/2021) 30 tablet 11  ? COVID-19 mRNA bivalent vaccine, Pfizer, (PFIZER COVID-19 VAC BIVALENT) injection Inject into the muscle. 0.3 mL 0  ? ?No facility-administered medications prior to visit.  ? ? ? ? ?ROS: ? ?Review of Systems  ?Constitutional:  Negative for fever.  ?Gastrointestinal:  Negative for blood in stool, constipation, diarrhea, nausea and vomiting.  ?Genitourinary:  Positive for dysuria, frequency and vaginal pain. Negative for dyspareunia, flank pain, hematuria, urgency, vaginal bleeding and vaginal discharge.  ?Musculoskeletal:  Negative for back pain.  ?Skin:  Negative for rash.  ?BREAST: No symptoms ? ? ?OBJECTIVE:  ? ?Vitals:  ?BP 126/70   Ht '5\' 2"'$  (1.575 m)   Wt 145 lb (65.8 kg)   BMI 26.52 kg/m?  ? ?Physical Exam ?Vitals reviewed.  ?Constitutional:   ?   Appearance: She is well-developed.  ?Pulmonary:  ?   Effort: Pulmonary effort is normal.  ?Genitourinary: ?   Pubic Area: No rash.   ?   Labia:     ?   Right: No rash, tenderness or  lesion.     ?   Left: Lesion present. No rash or tenderness.   ?   Vagina: Normal. No vaginal discharge, erythema or tenderness.  ?   Cervix: Normal.  ?   Uterus: Normal. Not enlarged and not tender.   ?   Adnexa: Right adnexa normal and left adnexa normal.    ?  Right: No mass or tenderness.      ?   Left: No mass or tenderness.    ? ? ?Musculoskeletal:     ?   General: Normal range of motion.  ?   Cervical back: Normal range of motion.  ?Skin: ?   General: Skin is warm and dry.  ?Neurological:  ?   General: No focal deficit present.  ?   Mental Status: She is alert and oriented to person, place, and time.  ?Psychiatric:     ?   Mood and Affect: Mood normal.     ?   Behavior: Behavior normal.     ?   Thought Content: Thought content normal.     ?   Judgment: Judgment normal.  ? ? ?Results: ?Results for orders placed or performed in visit on 05/15/21 (from the past 24 hour(s))  ?POCT Urinalysis Dipstick     Status: Normal  ? Collection Time: 05/15/21 11:29 AM  ?Result Value Ref Range  ? Color, UA yellow   ? Clarity, UA clear   ? Glucose, UA Negative Negative  ? Bilirubin, UA neg   ? Ketones, UA neg   ? Spec Grav, UA 1.020 1.010 - 1.025  ? Blood, UA neg   ? pH, UA 5.0 5.0 - 8.0  ? Protein, UA Negative Negative  ? Urobilinogen, UA    ? Nitrite, UA neg   ? Leukocytes, UA Negative Negative  ? Appearance    ? Odor    ? ? ? ?Assessment/Plan: ?Vaginal itching--sx improving. Most likely due to vaginal lesion, exam neg otherwise. F/u prn.  ? 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 OTC hydrocortisone crm ext prn itch, sx improving. F/u prn. If sx recur, can start valtrex Rx.  ? ?History of UTI - Plan: Urine Culture, POCT Urinalysis Dipstick; neg UA. Check C&S (pt going out of town next wk). If neg, sx could be from vaginal lesion sx. F/u prn.  ? ? ? Return if symptoms worsen or fail to improve. ? ?Rony Ratz B. Braden Deloach, PA-C ?05/15/2021 ?11:31 AM ? ? ? ? ? ?

## 2021-05-17 ENCOUNTER — Other Ambulatory Visit: Payer: Self-pay | Admitting: Dermatology

## 2021-05-18 LAB — URINE CULTURE

## 2021-05-19 MED ORDER — CIPROFLOXACIN HCL 500 MG PO TABS: 500.0000 mg | ORAL_TABLET | Freq: Two times a day (BID) | ORAL | 0 refills | Status: AC

## 2021-05-19 NOTE — Addendum Note (Signed)
Addended by: Ardeth Perfect B on: 10/22/1738 07:42 PM ? ? Modules accepted: Orders ? ?

## 2021-05-20 ENCOUNTER — Telehealth: Payer: Self-pay

## 2021-05-20 ENCOUNTER — Encounter: Payer: Self-pay | Admitting: Obstetrics and Gynecology

## 2021-05-20 MED ORDER — CIPROFLOXACIN HCL 250 MG PO TABS: 250.0000 mg | ORAL_TABLET | Freq: Two times a day (BID) | ORAL | 0 refills | Status: AC

## 2021-05-20 NOTE — Telephone Encounter (Signed)
As per Dr.MacDiarmid, Please let her know that 3 days of ciprofloxacin usually is okay but it would be safer to call in another 4 days worth so she could take it for 7 days total  ?Call in ciprofloxacin 250 mg twice a day for 4 days to add to her gynecology prescription  ?I recommend for her then to go on the prophylactic trimethoprim as we discussed and follow-up as scheduled  ? ? ?Sent in additional cipro per the provider. Sent my chart message to pt ?

## 2021-05-27 ENCOUNTER — Ambulatory Visit: Payer: Managed Care, Other (non HMO) | Admitting: Urology

## 2021-06-03 ENCOUNTER — Ambulatory Visit: Payer: Managed Care, Other (non HMO) | Admitting: Urology

## 2021-06-05 ENCOUNTER — Ambulatory Visit: Payer: Managed Care, Other (non HMO) | Admitting: Dermatology

## 2021-06-05 ENCOUNTER — Encounter: Payer: Self-pay | Admitting: Dermatology

## 2021-06-05 DIAGNOSIS — L820 Inflamed seborrheic keratosis: Secondary | ICD-10-CM

## 2021-06-05 DIAGNOSIS — S20162A Insect bite (nonvenomous) of breast, left breast, initial encounter: Secondary | ICD-10-CM

## 2021-06-05 DIAGNOSIS — B351 Tinea unguium: Secondary | ICD-10-CM | POA: Diagnosis not present

## 2021-06-05 DIAGNOSIS — W57XXXA Bitten or stung by nonvenomous insect and other nonvenomous arthropods, initial encounter: Secondary | ICD-10-CM | POA: Diagnosis not present

## 2021-06-05 DIAGNOSIS — L570 Actinic keratosis: Secondary | ICD-10-CM

## 2021-06-05 MED ORDER — FLUCONAZOLE 200 MG PO TABS: ORAL_TABLET | ORAL | 2 refills | Status: DC

## 2021-06-05 MED ORDER — TAVABOROLE 5 % EX SOLN: CUTANEOUS | 5 refills | Status: DC

## 2021-06-05 NOTE — Patient Instructions (Addendum)
Area on breast: ?Eucrisa samples given to be applied twice daily as needed.  ? ?Toenail: ?Start Kerydin every night to affected toenail. ? ?Continue Fluconazole '200mg'$  once a week as directed.  ? ?Have LFT's checked at upcoming appointment for CPE.  ? ?Apply Eucrisa to irritated lesions at bilateral flanks and right upper lip twice daily as needed for irritation.  ? ?Cryotherapy Aftercare ? ?Wash gently with soap and water everyday.   ?Apply Vaseline and Band-Aid daily until healed.  ? ?Prior to procedure, discussed risks of blister formation, small wound, skin dyspigmentation, or rare scar following cryotherapy. Recommend Vaseline ointment to treated areas while healing.  ? ?If You Need Anything After Your Visit ? ?If you have any questions or concerns for your doctor, please call our main line at [phone] and press option 4 to reach your doctor's medical assistant. If no one answers, please leave a voicemail as directed and we will return your call as soon as possible. Messages left after 4 pm will be answered the following business day.  ? ?You may also send [place] a message via MyChart. We typically respond to MyChart messages within 1-2 business days. ? ?For prescription refills, please ask your pharmacy to contact our office. Our fax number is [phone]. ? ?If you have an urgent issue when the clinic is closed that cannot wait until the next business day, you can page your doctor at the number below.   ? ?Please note that while we do our best to be available for urgent issues outside of office hours, we are not available 24/7.  ? ?If you have an urgent issue and are unable to reach [place], you may choose to seek medical care at your doctor's office, retail clinic, urgent care center, or emergency room. ? ?If you have a medical emergency, please immediately call 911 or go to the emergency department. ? ?Pager Numbers ? ?- Dr. Nehemiah Massed: [phone] ? ?- Dr. Laurence Ferrari: [phone] ? ?- Dr. Nicole Kindred: [phone] ? ?In  the event of inclement weather, please call our main line at [phone] for an update on the status of any delays or closures. ? ?Dermatology Medication Tips: ?Please keep the boxes that topical medications come in in order to help keep track of the instructions about where and how to use these. Pharmacies typically print the medication instructions only on the boxes and not directly on the medication tubes.  ? ?If your medication is too expensive, please contact our office at [phone] option 4 or send [place] a message through [place].  ? ?We are unable to tell what your co-pay for medications will be in advance as this is different depending on your insurance coverage. However, we may be able to find a substitute medication at lower cost or fill out paperwork to get insurance to cover a needed medication.  ? ?If a prior authorization is required to get your medication covered by your insurance company, please allow [place] 1-2 business days to complete this process. ? ?Drug prices often vary depending on where the prescription is filled and some pharmacies may offer cheaper prices. ? ?The website www.goodrx.com contains coupons for medications through different pharmacies. The prices here do not account for what the cost may be with help from insurance (it may be cheaper with your insurance), but the website can give you the price if you did not use any insurance.  ?- You can print the associated coupon and take it with your prescription to the pharmacy.  ?- You  may also stop by our office during regular business hours and pick up a GoodRx coupon card.  ?- If you need your prescription sent electronically to a different pharmacy, notify our office through [facility] or by phone at [phone] option 4. ? ? ? ? ?Si Usted Necesita Algo Despu?s de Su Visita ? ?Tambi?n puede enviarnos un mensaje a trav?s de MyChart. Por lo general respondemos a los mensajes de MyChart en el transcurso de 1 a 2 d?as  h?biles. ? ?Para renovar recetas, por favor pida a su farmacia que se ponga en contacto con nuestra oficina. Nuestro n?mero de fax es el [phone]. ? ?Si tiene un asunto urgente cuando la cl?nica est? cerrada y que no puede esperar hasta el siguiente d?a h?bil, puede llamar/localizar a su doctor(a) al n?mero que aparece a continuaci?n.  ? ?Por favor, tenga en cuenta que aunque hacemos todo lo posible para estar disponibles para asuntos urgentes fuera del horario de oficina, no estamos disponibles las 24 horas del d?a, los 7 d?as de la semana.  ? ?Si tiene un problema urgente y no puede comunicarse con nosotros, puede optar por buscar atenci?n m?dica  en el consultorio de su doctor(a), en una cl?nica privada, en un centro de atenci?n urgente o en una sala de emergencias. ? ?Si tiene Engineer, maintenance (IT) m?dica, por favor llame inmediatamente al 911 o vaya a la sala de emergencias. ? ?N?meros de b?per ? ?- Dr. Nehemiah Massed: [phone] ? ?- Dra. Moye: [phone] ? ?- Dra. Nicole Kindred: [phone] ? ?En caso de inclemencias del tiempo, por favor llame a nuestra l?nea principal al [phone] para una actualizaci?n sobre el estado de cualquier retraso o cierre. ? ?Consejos para la medicaci?n en dermatolog?a: ?Por favor, guarde las cajas en las que vienen los medicamentos de uso t?pico para ayudarle a seguir las instrucciones sobre d?nde y c?mo usarlos. Las farmacias generalmente imprimen las instrucciones del medicamento s?lo en las cajas y no directamente en los tubos del [place].  ? ?Si su medicamento es muy caro, por favor, p?ngase en contacto con Zigmund Daniel llamando al [phone] y presione la opci?n 4 o env?enos un mensaje a trav?s de MyChart.  ? ?No podemos decirle cu?l ser? su copago por los medicamentos por adelantado ya que esto es diferente dependiendo de la cobertura de su seguro. Sin embargo, es posible que podamos encontrar un medicamento sustituto a Electrical engineer un formulario para que el  seguro cubra el medicamento que se considera necesario.  ? ?Si se requiere Ardelia Mems autorizaci?n previa para que su compa??a de seguros [place] su medicamento, por favor perm?tanos de 1 a 2 d?as h?biles para completar este proceso. ? ?Los precios de los medicamentos var?an con frecuencia dependiendo del Environmental consultant de d?nde se surte la receta y alguna farmacias pueden ofrecer precios m?s baratos. ? ?El sitio web www.goodrx.com tiene cupones para medicamentos de Airline pilot. Los precios aqu? no tienen en cuenta lo que podr?a costar con la ayuda del seguro (puede ser m?s barato con su seguro), pero el sitio web puede darle el precio si no utiliz? ning?n seguro.  ?- Puede imprimir el cup?n correspondiente y llevarlo con su receta a la farmacia.  ?- Tambi?n puede pasar por nuestra oficina durante el horario de atenci?n regular y recoger una tarjeta de cupones de GoodRx.  ?- Si necesita que su receta se env?e electr?nicamente a Chiropodist, informe a nuestra oficina a trav?s de MyChart de [place] o por tel?fono llamando al [phone] y presione  la opci?n 4.  ?

## 2021-06-05 NOTE — Progress Notes (Signed)
? ?Follow-Up Visit ?  ?Subjective  ?Debra Dean is a 61 y.o. female who presents for the following: Nail Problem (Recheck onychomycosis. Right 2nd toenail. Taking Fluconazole '200mg'$  once a week since late June 2022) and Skin Problem (Check spots on torso and face. Raised, irritated). ? ? ? ?The following portions of the chart were reviewed this encounter and updated as appropriate:   ?  ? ?Review of Systems: No other skin or systemic complaints except as noted in HPI or Assessment and Plan. ? ? ?Objective  ?Well appearing patient in no apparent distress; mood and affect are within normal limits. ? ?A focused examination was performed including torso, right 2nd toenail. Relevant physical exam findings are noted in the Assessment and Plan. ? ?Right 2nd Toe Nail Plate ?Thickened with subungual debris at right 2nd toenail, wearing polish today. ? ?Left Breast areola ?Edematous pink papule ? ?Left Lower Back x1 ?Erythematous keratotic or waxy stuck-on papule L lower back. Pink slightly waxy macules/papules at right upper lip, BL flanks ? ?right nasal dorsum x1 ?Erythematous thin papules/macules with gritty scale.  ? ? ?Assessment & Plan  ?Onychomycosis ?Right 2nd Toe Nail Plate ? ?Culture positive for Aspergillus, pt has polish on- not able to assess improvement but nail still thickened.  Recommend she take off polish and take photo of toenail. ?Pt has failed PO Lamisil ? ?Start Kerydin solution every night to affected toenail. ? ?Continue Fluconazole '200mg'$  once a week as directed x 3 more months for one year course.  ?Side effects of fluconazole (diflucan) include nausea, diarrhea, headache, dizziness, taste changes, rare risk of irritation of the liver, allergy, or decreased blood counts (which could show up as infection or tiredness).  ? ?Will have LFT's checked at upcoming appointment, physical with PCP.  ? ?Tavaborole (KERYDIN) 5 % SOLN - Right 2nd Toe Nail Plate ?Apply QHS to affected toenail ? ?Bug bite without  infection, initial encounter ?Left Breast areola ? ?Benign, observe.   ?Eucrisa samples given to be applied twice daily as needed.  ? ?Inflamed seborrheic keratosis ?Left Lower Back x1 ? ?Apply Eucrisa ointment to ISK's at B/L flanks and right upper lip twice daily.  ? ?Destruction of lesion - Left Lower Back x1 ? ?Destruction method: cryotherapy   ?Informed consent: discussed and consent obtained   ?Lesion destroyed using liquid nitrogen: Yes   ?Region frozen until ice ball extended beyond lesion: Yes   ?Outcome: patient tolerated procedure well with no complications   ?Post-procedure details: wound care instructions given   ?Additional details:  Prior to procedure, discussed risks of blister formation, small wound, skin dyspigmentation, or rare scar following cryotherapy. Recommend Vaseline ointment to treated areas while healing.  ? ?AK (actinic keratosis) ?right nasal dorsum x1 ? ?Actinic keratoses are precancerous spots that appear secondary to cumulative UV radiation exposure/sun exposure over time. They are chronic with expected duration over 1 year. A portion of actinic keratoses will progress to squamous cell carcinoma of the skin. It is not possible to reliably predict which spots will progress to skin cancer and so treatment is recommended to prevent development of skin cancer. ? ?Recommend daily broad spectrum sunscreen SPF 30+ to sun-exposed areas, reapply every 2 hours as needed.  ?Recommend staying in the shade or wearing long sleeves, sun glasses (UVA+UVB protection) and wide brim hats (4-inch brim around the entire circumference of the hat). ?Call for new or changing lesions. ? ?Destruction of lesion - right nasal dorsum x1 ? ?Destruction method: cryotherapy   ?  Informed consent: discussed and consent obtained   ?Lesion destroyed using liquid nitrogen: Yes   ?Region frozen until ice ball extended beyond lesion: Yes   ?Outcome: patient tolerated procedure well with no complications   ?Post-procedure  details: wound care instructions given   ?Additional details:  Prior to procedure, discussed risks of blister formation, small wound, skin dyspigmentation, or rare scar following cryotherapy. Recommend Vaseline ointment to treated areas while healing.  ? ? ?Return in about 4 months (around 10/05/2021) for onychomycosis recheck, AK Follow Up, ISK Follow Up . ? ?I, Emelia Salisbury, CMA, am acting as scribe for Brendolyn Patty, MD. ? ?Documentation: I have reviewed the above documentation for accuracy and completeness, and I agree with the above. ? ?Brendolyn Patty MD  ? ? ?

## 2021-06-18 ENCOUNTER — Other Ambulatory Visit: Payer: Self-pay | Admitting: Family Medicine

## 2021-06-18 DIAGNOSIS — Z1231 Encounter for screening mammogram for malignant neoplasm of breast: Secondary | ICD-10-CM

## 2021-07-01 ENCOUNTER — Ambulatory Visit
Admission: RE | Admit: 2021-07-01 | Discharge: 2021-07-01 | Disposition: A | Discharge status: Home / Self Care | Payer: Managed Care, Other (non HMO) | Source: Ambulatory Visit | Attending: Family Medicine | Admitting: Family Medicine

## 2021-07-01 DIAGNOSIS — Z1231 Encounter for screening mammogram for malignant neoplasm of breast: Secondary | ICD-10-CM | POA: Diagnosis not present

## 2021-09-09 ENCOUNTER — Ambulatory Visit: Payer: Managed Care, Other (non HMO) | Admitting: Urology

## 2021-09-09 ENCOUNTER — Encounter: Payer: Self-pay | Admitting: Urology

## 2021-09-09 VITALS — BP 125/72 | HR 60 | Ht 62.0 in | Wt 142.0 lb

## 2021-09-09 DIAGNOSIS — R3915 Urgency of urination: Secondary | ICD-10-CM

## 2021-09-09 LAB — URINALYSIS, COMPLETE
Bilirubin, UA: NEGATIVE
Glucose, UA: NEGATIVE
Ketones, UA: NEGATIVE
Leukocytes,UA: NEGATIVE
Nitrite, UA: NEGATIVE
Protein,UA: NEGATIVE
RBC, UA: NEGATIVE
Specific Gravity, UA: 1.02 (ref 1.005–1.030)
Urobilinogen, Ur: 0.2 mg/dL (ref 0.2–1.0)
pH, UA: 7 (ref 5.0–7.5)

## 2021-09-09 LAB — MICROSCOPIC EXAMINATION: Bacteria, UA: NONE SEEN

## 2021-09-09 NOTE — Progress Notes (Signed)
09/09/2021 9:03 AM   Debra Dean 05-22-1960 [id number]  Referring provider: [name], MD [facility] [place],  [place] [address]  Chief Complaint  Patient presents with   Urinary Frequency    4 mo follow up    HPI: I was consulted to assess the patient's bladder infections.  In August she had blood in urine and left flank pain.  She was having pain with urination.  She describes taking antibiotics and her symptoms cleared but may have returned.  She had a second and perhaps a third antibiotic and then probably a negative culture.  She is almost back to normal but appears that her bladder is currently more frequent than it used to be.  She is voiding every 1-2 hours and her bladder is more urgent but she is not leaking.  She is now getting up once a night.  She was wearing liners at that time for protection but no longer is  Has had a hysterectomy.  Gets infrequent bladder infections  She had a normal CT stone protocol June 2021.  She had a negative urine culture November 30, 2020   Works in the [employer].  Used to smoke.  No daily aspirin or blood thinner     Patient likely had a bladder infection and now has up regulation of a mild overactive bladder that should normalize over time.     CT scan with contrast and cysto normal.  Last urine culture positive Urgency and frequency definitely settling.  Still gets up once a night sometimes.  No blood in urine   Patient has been cleared for hematuria.     Patient was treated for urinary tract infection in February after being treated for a sinus infection with prednisone and antibiotics.  They gave her Macrodantin followed by Augmentin.  Her symptoms came back in March and now she is on a half a tablet of Bactrim daily.  Now has some vague back pain on the left side again but no other cystitis symptoms.  The last culture I see on the chart was in October and it was positive  Patient  understands that kidneys are normal relative to back pain.  Send urine for culture.  Patient may have gotten short of breath on Macrodantin.  I do not think she is sleepy from the sulfa drug.  She is going to the beach and does not want to be sun sensitive.  I switch her to trimethoprim 100 mg 3x11.  Make sure cultures are continuing to be sent.  It does sound like she had a positive culture recently by primary care.  Reassess in 4 months  Today Last culture positive Patient has been reluctant to take the daily trimethoprim.  Some days she voids more frequently than others.  Some days she gets up at night and other she does not.  She has ongoing back pain is seen physical therapy.  She thought she may have seen some blood with wiping recently.        PMH: Past Medical History:  Diagnosis Date   Anginal pain ([place])    Carpal tunnel syndrome    mild-mod left, mild right   Central sleep apnea    Dyspnea    had stress test and negative having another sleep study and MRI of brain   GERD (gastroesophageal reflux disease)    Headache    Herpes genitalis    HSV in her 13s   History of  abnormal Pap smear    Hypertension    IBS (irritable bowel syndrome)    Migraine    Osteoarthritis    Ovarian cyst    Recurrent sinusitis     Surgical History: Past Surgical History:  Procedure Laterality Date   ABDOMINAL HYSTERECTOMY  2005   bilateral carpal tunnel release  2006   CERVICAL CONE BIOPSY  1986   COLONOSCOPY WITH PROPOFOL N/A 06/23/2016   Procedure: COLONOSCOPY WITH PROPOFOL;  Surgeon: Lollie Sails, MD;  Location: [facility] ENDOSCOPY;  Service: Endoscopy;  Laterality: N/A;   ENDOMETRIAL ABLATION  2001   ESOPHAGOGASTRODUODENOSCOPY (EGD) WITH PROPOFOL N/A 06/23/2016   Procedure: ESOPHAGOGASTRODUODENOSCOPY (EGD) WITH PROPOFOL;  Surgeon: Lollie Sails, MD;  Location: [facility] ENDOSCOPY;  Service: Endoscopy;  Laterality: N/A;   RHINOPLASTY  2004   TONSILLECTOMY  1965   TOTAL ABDOMINAL  HYSTERECTOMY  2002   LSO has right ovary and tube, MAD   TUBAL LIGATION      Home Medications:  Allergies as of 09/09/2021       Reactions   Codeine    Nitrofurantoin Other (See Comments)   Shortness of breath   Ace Inhibitors Rash   Hydrocodone Bit-homatrop Mbr Nausea Only, Nausea And Vomiting        Medication List        Accurate as of September 09, 2021  9:03 AM. If you have any questions, ask your nurse or doctor.          albuterol 108 (90 Base) MCG/ACT inhaler Commonly known as: VENTOLIN HFA Inhale into the lungs.   aluminum hydroxide-magnesium carbonate 95-358 MG/15ML Susp Commonly known as: GAVISCON Take by mouth.   amLODipine 2.5 MG tablet Commonly known as: NORVASC Take 2.5 mg by mouth daily.   cholecalciferol 10 MCG (400 UNIT) Tabs tablet Commonly known as: VITAMIN D3 Take 1,000 Units by mouth.   clotrimazole-betamethasone cream Commonly known as: LOTRISONE Apply externally BID prn sx up to 2 wks   fluconazole 200 MG tablet Commonly known as: DIFLUCAN TAKE 1 TABLET BY MOUTH ONCE A WEEK AS DIRECTED   fluticasone 50 MCG/ACT nasal spray Commonly known as: FLONASE Place 2 sprays into the nose daily.   metoprolol succinate 25 MG 24 hr tablet Commonly known as: TOPROL-XL TK 1/2 T PO QD   potassium chloride 10 MEQ tablet Commonly known as: KLOR-CON Take 10 mEq by mouth daily.   RABEprazole 20 MG tablet Commonly known as: ACIPHEX Take 20 mg by mouth daily.   sodium chloride 0.65 % nasal spray Commonly known as: OCEAN Place into the nose.   Tavaborole 5 % Soln Commonly known as: Kerydin Apply QHS to affected toenail   triamcinolone cream 0.5 % Commonly known as: KENALOG Apply topically 2 (two) times daily.   triamterene-hydrochlorothiazide 37.5-25 MG tablet Commonly known as: MAXZIDE-25 1/2 tablet q day   trimethoprim 100 MG tablet Commonly known as: TRIMPEX Take 1 tablet (100 mg total) by mouth daily.   umeclidinium-vilanterol  62.5-25 MCG/INH Aepb Commonly known as: ANORO ELLIPTA Inhale into the lungs.   vitamin B-12 1000 MCG tablet Commonly known as: CYANOCOBALAMIN Take by mouth.        Allergies:  Allergies  Allergen Reactions   Codeine    Nitrofurantoin Other (See Comments)    Shortness of breath   Ace Inhibitors Rash   Hydrocodone Bit-Homatrop Mbr Nausea Only and Nausea And Vomiting    Family History: Family History  Problem Relation Age of Onset   Hypertension Mother  Kidney disease Mother    Diabetes Father        DM Type 2   Hypertension Father    Prostate cancer Father    Other Father        Pre Leukemia   Endometriosis Sister    Asthma Brother    Leukemia Maternal Grandmother    Colon cancer Maternal Grandfather 24   Breast cancer Maternal Aunt        70's, tested and treated, has contact w her   Uterine cancer Maternal Aunt 63   Prostate cancer Maternal Uncle        no mets   Prostate cancer Maternal Uncle        no mets    Social History:  reports that she quit smoking about 26 years ago. Her smoking use included cigarettes. She has never used smokeless tobacco. She reports that she does not drink alcohol and does not use drugs.  ROS:                                        Physical Exam: There were no vitals taken for this visit.  Constitutional:  Alert and oriented, No acute distress.   Laboratory Data: No results found for: "WBC", "HGB", "HCT", "MCV", "PLT"  Lab Results  Component Value Date   CREATININE 0.70 01/23/2021    No results found for: "PSA"  No results found for: "TESTOSTERONE"  No results found for: "HGBA1C"  Urinalysis    Component Value Date/Time   APPEARANCEUR Clear 05/06/2021 0945   GLUCOSEU Negative 05/06/2021 0945   BILIRUBINUR neg 05/15/2021 1129   BILIRUBINUR Negative 05/06/2021 0945   PROTEINUR Negative 05/15/2021 1129   PROTEINUR Negative 05/06/2021 0945   NITRITE neg 05/15/2021 1129   NITRITE  Negative 05/06/2021 0945   LEUKOCYTESUR Negative 05/15/2021 1129   LEUKOCYTESUR Negative 05/06/2021 0945    Pertinent Imaging: Urine looked normal but sent for culture   Assessment & Plan: We spoke about prophylaxis safety versus treating infections as needed.  I do not think she needs an overactive bladder medication for intermittent frequency and nocturia.  Multiple questions answered.  Prophylaxis discussed in detail for treating infections as needed.  She does describe perhaps some low count cystitis in the past and I commented on this.  I will call if the culture is positive and treat infections as needed.  I will see her as needed  1. Urinary urgency  - Urinalysis, Complete   No follow-ups on file.  Reece Packer, MD  [place] [place] [address] [place], [place] [address] [phone]

## 2021-09-12 LAB — CULTURE, URINE COMPREHENSIVE

## 2021-10-24 ENCOUNTER — Encounter: Payer: Self-pay | Admitting: Obstetrics and Gynecology

## 2021-10-24 ENCOUNTER — Ambulatory Visit (INDEPENDENT_AMBULATORY_CARE_PROVIDER_SITE_OTHER): Payer: Managed Care, Other (non HMO) | Admitting: Obstetrics and Gynecology

## 2021-10-24 ENCOUNTER — Ambulatory Visit: Payer: Managed Care, Other (non HMO) | Admitting: Dermatology

## 2021-10-24 VITALS — BP 100/60 | Ht 62.0 in | Wt 144.0 lb

## 2021-10-24 DIAGNOSIS — L570 Actinic keratosis: Secondary | ICD-10-CM

## 2021-10-24 DIAGNOSIS — L988 Other specified disorders of the skin and subcutaneous tissue: Secondary | ICD-10-CM | POA: Diagnosis not present

## 2021-10-24 DIAGNOSIS — L814 Other melanin hyperpigmentation: Secondary | ICD-10-CM | POA: Diagnosis not present

## 2021-10-24 DIAGNOSIS — N390 Urinary tract infection, site not specified: Secondary | ICD-10-CM

## 2021-10-24 DIAGNOSIS — N952 Postmenopausal atrophic vaginitis: Secondary | ICD-10-CM

## 2021-10-24 DIAGNOSIS — Z1329 Encounter for screening for other suspected endocrine disorder: Secondary | ICD-10-CM

## 2021-10-24 DIAGNOSIS — L299 Pruritus, unspecified: Secondary | ICD-10-CM | POA: Diagnosis not present

## 2021-10-24 DIAGNOSIS — B351 Tinea unguium: Secondary | ICD-10-CM | POA: Diagnosis not present

## 2021-10-24 DIAGNOSIS — Z1211 Encounter for screening for malignant neoplasm of colon: Secondary | ICD-10-CM

## 2021-10-24 DIAGNOSIS — N760 Acute vaginitis: Secondary | ICD-10-CM

## 2021-10-24 DIAGNOSIS — L821 Other seborrheic keratosis: Secondary | ICD-10-CM

## 2021-10-24 DIAGNOSIS — N898 Other specified noninflammatory disorders of vagina: Secondary | ICD-10-CM

## 2021-10-24 DIAGNOSIS — Z1231 Encounter for screening mammogram for malignant neoplasm of breast: Secondary | ICD-10-CM

## 2021-10-24 DIAGNOSIS — Z01419 Encounter for gynecological examination (general) (routine) without abnormal findings: Secondary | ICD-10-CM | POA: Diagnosis not present

## 2021-10-24 DIAGNOSIS — L659 Nonscarring hair loss, unspecified: Secondary | ICD-10-CM

## 2021-10-24 MED ORDER — VALACYCLOVIR HCL 500 MG PO TABS: 500.0000 mg | ORAL_TABLET | Freq: Two times a day (BID) | ORAL | 0 refills | Status: AC

## 2021-10-24 MED ORDER — ESTRADIOL 0.1 MG/GM VA CREA: TOPICAL_CREAM | VAGINAL | 0 refills | Status: DC

## 2021-10-24 MED ORDER — CLOTRIMAZOLE-BETAMETHASONE 1-0.05 % EX CREA: TOPICAL_CREAM | CUTANEOUS | 1 refills | Status: DC

## 2021-10-24 MED ORDER — FLUCONAZOLE 200 MG PO TABS: ORAL_TABLET | ORAL | 1 refills | Status: DC

## 2021-10-24 NOTE — Patient Instructions (Addendum)
Cryotherapy Aftercare  Wash gently with soap and water everyday.   Apply Vaseline daily until healed.   Toenails: Start Kerydin solution every night at bedtime to affected toenails.  Continue Fluconazole '200mg'$  1 tablet once a week for 6 more months  Side effects of fluconazole (diflucan) include nausea, diarrhea, headache, dizziness, taste changes, rare risk of irritation of the liver, allergy, or decreased blood counts (which could show up as infection or tiredness).  Gentle Skin Care Guide  1. Bathe no more than once a day.  2. Avoid bathing in hot water  3. Use a mild soap like Dove, Vanicream, Cetaphil, CeraVe. Can use Lever 2000 or Cetaphil antibacterial soap  4. Use soap only where you need it. On most days, use it under your arms, between your legs, and on your feet. Let the water rinse other areas unless visibly dirty.  5. When you get out of the bath/shower, use a towel to gently blot your skin dry, don't rub it.  6. While your skin is still a little damp, apply a moisturizing cream such as Vanicream, CeraVe, Cetaphil, Eucerin, Sarna lotion or plain Vaseline Jelly. For hands apply Neutrogena [place] Hand Cream or Excipial Hand Cream.  7. Reapply moisturizer any time you start to itch or feel dry.  8. Sometimes using free and clear laundry detergents can be helpful. Fabric softener sheets should be avoided. Downy Free & Gentle liquid, or any liquid fabric softener that is free of dyes and perfumes, it acceptable to use  9. If your doctor has given you prescription creams you may apply moisturizers over them      Due to recent changes in healthcare laws, you may see results of your pathology and/or laboratory studies on MyChart before the doctors have had a chance to review them. We understand that in some cases there may be results that are confusing or concerning to you. Please understand that not all results are received at the same time and often the doctors may need  to interpret multiple results in order to provide you with the best plan of care or course of treatment. Therefore, we ask that you please give [place] 2 business days to thoroughly review all your results before contacting the office for clarification. Should we see a critical lab result, you will be contacted sooner.   If You Need Anything After Your Visit  If you have any questions or concerns for your doctor, please call our main line at [phone] and press option 4 to reach your doctor's medical assistant. If no one answers, please leave a voicemail as directed and we will return your call as soon as possible. Messages left after 4 pm will be answered the following business day.   You may also send [place] a message via [place]. We typically respond to MyChart messages within 1-2 business days.  For prescription refills, please ask your pharmacy to contact our office. Our fax number is [phone].  If you have an urgent issue when the clinic is closed that cannot wait until the next business day, you can page your doctor at the number below.    Please note that while we do our best to be available for urgent issues outside of office hours, we are not available 24/7.   If you have an urgent issue and are unable to reach [place], you may choose to seek medical care at your doctor's office, retail clinic, urgent care center, or emergency room.  If you have a medical  emergency, please immediately call 911 or go to the emergency department.  Pager Numbers  - Dr. Nehemiah Massed: [phone]  - Dr. Laurence Ferrari: [phone]  - Dr. Nicole Kindred: [phone]  In the event of inclement weather, please call our main line at [phone] for an update on the status of any delays or closures.  Dermatology Medication Tips: Please keep the boxes that topical medications come in in order to help keep track of the instructions about where and how to use these. Pharmacies typically print the medication instructions only on  the boxes and not directly on the medication tubes.   If your medication is too expensive, please contact our office at [phone] option 4 or send [place] a message through [place].   We are unable to tell what your co-pay for medications will be in advance as this is different depending on your insurance coverage. However, we may be able to find a substitute medication at lower cost or fill out paperwork to get insurance to cover a needed medication.   If a prior authorization is required to get your medication covered by your insurance company, please allow [place] 1-2 business days to complete this process.  Drug prices often vary depending on where the prescription is filled and some pharmacies may offer cheaper prices.  The website www.goodrx.com contains coupons for medications through different pharmacies. The prices here do not account for what the cost may be with help from insurance (it may be cheaper with your insurance), but the website can give you the price if you did not use any insurance.  - You can print the associated coupon and take it with your prescription to the pharmacy.  - You may also stop by our office during regular business hours and pick up a GoodRx coupon card.  - If you need your prescription sent electronically to a different pharmacy, notify our office through [facility] or by phone at [phone] option 4.     Si Usted Necesita Algo Despus de Su Visita  Tambin puede enviarnos un mensaje a travs de Pharmacist, community. Por lo general respondemos a los mensajes de MyChart en el transcurso de 1 a 2 das hbiles.  Para renovar recetas, por favor pida a su farmacia que se ponga en contacto con nuestra oficina. Harland Dingwall de fax es [place] [phone].  Si tiene un asunto urgente cuando la clnica est cerrada y que no puede esperar hasta el siguiente da hbil, puede llamar/localizar a su doctor(a) al nmero que aparece a continuacin.   Por favor, tenga en cuenta que  aunque hacemos todo lo posible para estar disponibles para asuntos urgentes fuera del horario de [place], no estamos disponibles las 24 horas del da, los 7 das de la [place].   Si tiene un problema urgente y no puede comunicarse con nosotros, puede optar por buscar atencin mdica  en el consultorio de su doctor(a), en una clnica privada, en un centro de atencin urgente o en una sala de emergencias.  Si tiene Engineering geologist, por favor llame inmediatamente al 911 o vaya a la sala de emergencias.  Nmeros de bper  - Dr. Nehemiah Massed: [phone]  - Dra. Moye: [phone]  - Dra. Nicole Kindred: [phone]  En caso de inclemencias del [place], por favor llame a Johnsie Kindred principal al [phone] para una actualizacin sobre el [place] de cualquier retraso o cierre.  Consejos para la medicacin en dermatologa: Por favor, guarde las cajas en las que vienen los medicamentos de uso tpico para ayudarle a  seguir las [employer] dnde y cmo usarlos. Las farmacias generalmente imprimen las instrucciones del medicamento slo en las cajas y no directamente en los tubos del [place].   Si su medicamento es muy caro, por favor, pngase en contacto con Zigmund Daniel llamando al [phone] y presione la opcin 4 o envenos un mensaje a travs de Pharmacist, community.   No podemos decirle cul ser su copago por los medicamentos por adelantado ya que esto es diferente dependiendo de la cobertura de su seguro. Sin embargo, es posible que podamos encontrar un medicamento sustituto a Electrical engineer un formulario para que el seguro cubra el medicamento que se considera necesario.   Si se requiere una autorizacin previa para que su compaa de seguros [place] su medicamento, por favor permtanos de 1 a 2 das hbiles para completar este proceso.  Los precios de los medicamentos varan con frecuencia dependiendo del Environmental consultant de dnde se surte la receta y alguna farmacias pueden ofrecer precios ms  baratos.  El sitio web www.goodrx.com tiene cupones para medicamentos de Airline pilot. Los precios aqu no tienen en cuenta lo que podra costar con la ayuda del seguro (puede ser ms barato con su seguro), pero el sitio web puede darle el precio si no utiliz Research scientist (physical sciences).  - Puede imprimir el cupn correspondiente y llevarlo con su receta a la farmacia.  - Tambin puede pasar por nuestra oficina durante el horario de atencin regular y Charity fundraiser una tarjeta de cupones de GoodRx.  - Si necesita que su receta se enve electrnicamente a una farmacia diferente, informe a nuestra oficina a travs de MyChart de [place] o por telfono llamando al [phone] y presione la opcin 4.

## 2021-10-24 NOTE — Progress Notes (Signed)
Chief Complaint  Patient presents with   Gynecologic Exam    RF on lotrisone, has had fatigue and hair loss     HPI:      Debra Dean is a 61 y.o. G1P1 who LMP was No LMP recorded. Patient has had a hysterectomy., presents today for her annual examination.  Her menses are absent due to hysterectomy. She does not have PMB.  She has rare vasomotor sx.   Occas still has irritation in perianal area, but has hemorrhoids. Has some ext vaginal irritation as well at times. Improved with lotrisone crm prn. Needs RF. Tries to keep area dry.    Sex activity: single partner, contraception - status post hysterectomy. She does have vaginal dryness, no recent small amount of red blood with sex, improved with lubricants. She can't tolerate oral, topical or patch ERT due to headaches.  Has been having frequent UTIs this yr. Never did vag ERT in past.   Last Pap: 03/29/18  Results were: no abnormalities /neg HPV DNA.  Hx of STDs: HSV  Last mammogram: 07/01/21 with PCP; Results were: normal--routine follow-up in 12 months There is a FH of breast cancer in her mat aunt, BRCA genetic testing not indicated. There is no FH of ovarian cancer, but there is a FH of nonmetastatic prostate cancer in 2 mat uncles, uterine cancer in her mat aunt, and colon cancer in her MGF. The patient does occas do self-breast exams. Pt has noticed a red area near RT nipple a couple times recently that resolves on its own. No d/c/ no mass.  Colonoscopy: 2018 with 1 polyp. Repeat due after 5 yrs. FH colon cancer in her MGF. Has upcoming GI appt.   Tobacco use: The patient denies current or previous tobacco use. Alcohol use: none No drug use. Exercise: mod active  She does get adequate calcium and Vitamin D in her diet. She has labs with PCP. Has pre-DM on 6/23 labs, no recent thyroid check. Normal ferritin on 1/23 labs.  Pt with increased fatigue, sleeps 6-6 1/2 hrs nightly. Does snore. Has also had hair loss with thin  spots, no FH baldness. Is under increased stress, not taking hair/nail supp.   Past Medical History:  Diagnosis Date   Anginal pain ([place])    Carpal tunnel syndrome    mild-mod left, mild right   Central sleep apnea    Dyspnea    had stress test and negative having another sleep study and MRI of brain   GERD (gastroesophageal reflux disease)    Headache    Herpes genitalis    HSV in her 37s   History of abnormal Pap smear    Hypertension    IBS (irritable bowel syndrome)    Migraine    Osteoarthritis    Ovarian cyst    Recurrent sinusitis     Past Surgical History:  Procedure Laterality Date   ABDOMINAL HYSTERECTOMY  2005   bilateral carpal tunnel release  2006   CERVICAL CONE BIOPSY  1986   COLONOSCOPY WITH PROPOFOL N/A 06/23/2016   Procedure: COLONOSCOPY WITH PROPOFOL;  Surgeon: Lollie Sails, MD;  Location[facility] ENDOSCOPY;  Service: Endoscopy;  Laterality: N/A;   ENDOMETRIAL ABLATION  2001   ESOPHAGOGASTRODUODENOSCOPY (EGD) WITH PROPOFOL N/A 06/23/2016   Procedure: ESOPHAGOGASTRODUODENOSCOPY (EGD) WITH PROPOFOL;  Surgeon: Lollie Sails, MD;  Location: [facility] ENDOSCOPY;  Service: Endoscopy;  Laterality: N/A;   RHINOPLASTY  2004   TONSILLECTOMY  1965   TOTAL ABDOMINAL HYSTERECTOMY  2002   LSO has right ovary and tube, MAD   TUBAL LIGATION      Family History  Problem Relation Age of Onset   Hypertension Mother    Kidney disease Mother    Diabetes Father        DM Type 2   Hypertension Father    Prostate cancer Father    Other Father        Pre Leukemia   Endometriosis Sister    Asthma Brother    Leukemia Maternal Grandmother    Colon cancer Maternal Grandfather 41   Breast cancer Maternal Aunt        70's, tested and treated, has contact w her   Uterine cancer Maternal Aunt 63   Prostate cancer Maternal Uncle        no mets   Prostate cancer Maternal Uncle        no mets    Social History   Socioeconomic History   Marital status: Married     Spouse name: Not on file   Number of children: 1   Years of education: Not on file   Highest education level: Not on file  Occupational History   Not on file  Tobacco Use   Smoking status: Former    Types: Cigarettes    Quit date: 02/18/1995    Years since quitting: 26.6   Smokeless tobacco: Never  Vaping Use   Vaping Use: Never used  Substance and Sexual Activity   Alcohol use: No   Drug use: No   Sexual activity: Yes    Birth control/protection: Surgical    Comment: Hysterectomy  Other Topics Concern   Not on file  Social History Narrative   Not on file   Social Determinants of Health   Financial Resource Strain: Not on file  Food Insecurity: Not on file  Transportation Needs: Not on file  Physical Activity: Not on file  Stress: Not on file  Social Connections: Not on file  Intimate Partner Violence: Not on file     Current Outpatient Medications:    albuterol (PROVENTIL HFA;VENTOLIN HFA) 108 (90 Base) MCG/ACT inhaler, Inhale into the lungs., Disp: , Rfl:    aluminum hydroxide-magnesium carbonate (GAVISCON) 95-358 MG/15ML SUSP, Take by mouth., Disp: , Rfl:    amLODipine (NORVASC) 2.5 MG tablet, Take 2.5 mg by mouth daily., Disp: , Rfl:    cholecalciferol (VITAMIN D) 400 units TABS tablet, Take 1,000 Units by mouth., Disp: , Rfl:    estradiol (ESTRACE) 0.1 MG/GM vaginal cream, AAA nightly for 1 wk, then every 3 days as maintenance, Disp: 42.5 g, Rfl: 0   fluticasone (FLONASE) 50 MCG/ACT nasal spray, Place 2 sprays into the nose daily., Disp: , Rfl:    metoprolol succinate (TOPROL-XL) 25 MG 24 hr tablet, TK 1/2 T PO QD, Disp: , Rfl:    potassium chloride (K-DUR) 10 MEQ tablet, Take 10 mEq by mouth daily., Disp: , Rfl:    RABEprazole (ACIPHEX) 20 MG tablet, Take 20 mg by mouth daily., Disp: , Rfl:    sodium chloride (OCEAN) 0.65 % nasal spray, Place into the nose., Disp: , Rfl:    Tavaborole (KERYDIN) 5 % SOLN, Apply QHS to affected toenail, Disp: 10 mL, Rfl: 5   TRELEGY  ELLIPTA 100-62.5-25 MCG/ACT AEPB, Inhale 1 puff into the lungs daily., Disp: , Rfl:    triamterene-hydrochlorothiazide (MAXZIDE-25) 37.5-25 MG per tablet, 1/2 tablet q day, Disp: , Rfl:    vitamin B-12 (CYANOCOBALAMIN) 1000 MCG  tablet, Take by mouth., Disp: , Rfl:    clotrimazole-betamethasone (LOTRISONE) cream, Apply externally BID prn sx up to 2 wks, Disp: 15 g, Rfl: 1   fluconazole (DIFLUCAN) 200 MG tablet, TAKE 1 TABLET BY MOUTH ONCE A WEEK AS DIRECTED, Disp: 8 tablet, Rfl: 1   trimethoprim (TRIMPEX) 100 MG tablet, Take 1 tablet (100 mg total) by mouth daily. (Patient not taking: Reported on 10/24/2021), Disp: 30 tablet, Rfl: 11   valACYclovir (VALTREX) 500 MG tablet, Take 1 tablet (500 mg total) by mouth 2 (two) times daily for 8 days., Disp: 16 tablet, Rfl: 0   ROS:  Review of Systems  Constitutional:  Positive for fatigue. Negative for fever and unexpected weight change.  Respiratory:  Negative for cough, shortness of breath and wheezing.   Cardiovascular:  Negative for chest pain, palpitations and leg swelling.  Gastrointestinal:  Negative for blood in stool, constipation, diarrhea, nausea and vomiting.  Endocrine: Negative for cold intolerance, heat intolerance and polyuria.  Genitourinary:  Negative for dyspareunia, dysuria, flank pain, frequency, genital sores, hematuria, menstrual problem, pelvic pain, urgency, vaginal bleeding, vaginal discharge and vaginal pain.  Musculoskeletal:  Positive for arthralgias. Negative for back pain, joint swelling and myalgias.  Skin:  Negative for rash.  Neurological:  Negative for dizziness, syncope, light-headedness, numbness and headaches.  Hematological:  Negative for adenopathy.  Psychiatric/Behavioral:  Negative for agitation, confusion, sleep disturbance and suicidal ideas. The patient is not nervous/anxious.   BREAST: tenderness   Objective: BP 100/60   Ht _0  (1.575 m)   Wt 144 lb (65.3 kg)   BMI 26.34 kg/m    Physical  Exam Constitutional:      Appearance: She is well-developed.  Genitourinary:     Vulva normal.     Genitourinary Comments: UTERUS/CX SURG REM     Right Labia: No rash, tenderness or lesions.    Left Labia: No tenderness, lesions or rash.    Vaginal cuff intact.    No vaginal discharge, erythema or tenderness.     Mild vaginal atrophy present.     Right Adnexa: not tender and no mass present.    Left Adnexa: not tender and no mass present.    Cervix is not absent.     No cervical friability or polyp.     Uterus is not enlarged or tender.     Uterus is not absent. Breasts:    Right: Skin change present. No mass, nipple discharge or tenderness.     Left: No mass, nipple discharge, skin change or tenderness.  Neck:     Thyroid: No thyromegaly.  Cardiovascular:     Rate and Rhythm: Normal rate and regular rhythm.     Heart sounds: Normal heart sounds. No murmur heard. Pulmonary:     Effort: Pulmonary effort is normal.     Breath sounds: Normal breath sounds.  Chest:    Abdominal:     Palpations: Abdomen is soft.     Tenderness: There is no abdominal tenderness. There is no guarding or rebound.  Musculoskeletal:        General: Normal range of motion.     Cervical back: Normal range of motion.  Lymphadenopathy:     Cervical: No cervical adenopathy.  Neurological:     General: No focal deficit present.     Mental Status: She is alert and oriented to person, place, and time.     Cranial Nerves: No cranial nerve deficit.  Skin:    General: Skin is  warm and dry.  Psychiatric:        Mood and Affect: Mood normal.        Behavior: Behavior normal.        Thought Content: Thought content normal.        Judgment: Judgment normal.  Vitals reviewed.     Assessment/Plan: Encounter for annual routine gynecological examination  Encounter for screening mammogram for malignant neoplasm of breast; pt current on mammo, done with PCP.   Pt most likely has fungal infection ext RT  breast. Treat with lotrisone crm, f/u in 2-3 wks if still present.   Screening for colon cancer--pt has upcoming GI appt.   Hair loss - Plan: TSH + free T4; check labs. If neg, most likely stress. Add hair supp.   Thyroid disorder screening - Plan: TSH + free T4; with increased fatigue. If neg, increase sleep duration. If still sx, should do sleep study to rule out apnea.   Postmenopausal atrophic vaginitis - Plan: estradiol (ESTRACE) 0.1 MG/GM vaginal cream; mild atrophy with frequent UTIs now. Add vag ERT ext for sx. F/u prn.   Frequent UTI - Plan: estradiol (ESTRACE) 0.1 MG/GM vaginal cream; try vag ERT  Acute vaginitis - Plan: clotrimazole-betamethasone (LOTRISONE) cream; Rx RF. Pt uses occas. No sx today.   Fam history of multiple cancers--doesn't qualify for Colaris/MyRisk testing. Will cont to follow FH.          Meds ordered this encounter  Medications   estradiol (ESTRACE) 0.1 MG/GM vaginal cream    Sig: AAA nightly for 1 wk, then every 3 days as maintenance    Dispense:  42.5 g    Refill:  0    Order Specific Question:   Supervising Provider    Answer:   Rubie Maid [[id number]]   clotrimazole-betamethasone (LOTRISONE) cream    Sig: Apply externally BID prn sx up to 2 wks    Dispense:  15 g    Refill:  1    Order Specific Question:   Supervising Provider    Answer:   Renaldo Reel    GYN counsel mammography screening, menopause, adequate intake of calcium and vitamin D, diet and exercise     F/U  Return in about 1 year (around 10/25/2022).  Brinna Divelbiss B. Danity Schmelzer, PA-C 10/24/2021 5:13 PM

## 2021-10-24 NOTE — Progress Notes (Signed)
Follow-Up Visit   Subjective  Debra Dean is a 61 y.o. female who presents for the following: Nail Problem (5 month recheck. R-1, 2 toenails. Has been on Fluconazole >1 year. Failed Terbinafine. Has Kerydin topical but has not started. Thinks nails are finally starting to improve ) and Actinic Keratosis (Follow up. Right nasal dorsum. HxISK at left lower back. Tx with LN2 at last visit).  The patient has spots, moles and lesions to be evaluated, some may be new or changing and the patient has concerns that these could be cancer.  The following portions of the chart were reviewed this encounter and updated as appropriate:  Tobacco  Allergies  Meds  Problems  Med Hx  Surg Hx  Fam Hx      Review of Systems: No other skin or systemic complaints except as noted in HPI or Assessment and Plan.   Objective  Well appearing patient in no apparent distress; mood and affect are within normal limits.  A focused examination was performed including face, toenails, back. Relevant physical exam findings are noted in the Assessment and Plan.  Right Hallux Toe Nail Plate Discoloration with thickening of nail  Left Cheek x1, left jaw x1 (2) Erythematous thin papules/macules with gritty scale.   Mid Back Xerosis   face Rhytides and volume loss.    Assessment & Plan  Onychomycosis Right Hallux Toe Nail Plate  Chronic and persistent condition with duration or expected duration over one year. Condition is symptomatic / bothersome to patient. Not to goal.  Start Kerydin solution every night at bedtime to affected toenails.  Continue Fluconazole '200mg'$  1 tablet once a week for 6 more months  Related Medications Tavaborole (KERYDIN) 5 % SOLN Apply QHS to affected toenail  fluconazole (DIFLUCAN) 200 MG tablet TAKE 1 TABLET BY MOUTH ONCE A WEEK AS DIRECTED  AK (actinic keratosis) (2) Left Cheek x1, left jaw x1  Actinic keratoses are precancerous spots that appear secondary to  cumulative UV radiation exposure/sun exposure over time. They are chronic with expected duration over 1 year. A portion of actinic keratoses will progress to squamous cell carcinoma of the skin. It is not possible to reliably predict which spots will progress to skin cancer and so treatment is recommended to prevent development of skin cancer.  Recommend daily broad spectrum sunscreen SPF 30+ to sun-exposed areas, reapply every 2 hours as needed.  Recommend staying in the shade or wearing long sleeves, sun glasses (UVA+UVB protection) and wide brim hats (4-inch brim around the entire circumference of the hat). Call for new or changing lesions.  Destruction of lesion - Left Cheek x1, left jaw x1  Destruction method: cryotherapy   Informed consent: discussed and consent obtained   Lesion destroyed using liquid nitrogen: Yes   Outcome: patient tolerated procedure well with no complications   Post-procedure details: wound care instructions given   Additional details:  Prior to procedure, discussed risks of blister formation, small wound, skin dyspigmentation, or rare scar following cryotherapy. Recommend Vaseline ointment to treated areas while healing.   Pruritus Mid Back  Discussed gentle skin care. Information given.  Elastosis of skin face  Discussed The Perfect [employer]el.  24-36 hours prior to procedure start Valacyclovir 500 mg 1 tablet twice daily for 8 days    valACYclovir (VALTREX) 500 MG tablet - face Take 1 tablet (500 mg total) by mouth 2 (two) times daily for 8 days.   Lentigines - Scattered tan macules - Due to sun exposure -  Benign-appering, observe - Recommend daily broad spectrum sunscreen SPF 30+ to sun-exposed areas, reapply every 2 hours as needed. - Call for any changes  Seborrheic Keratoses - Stuck-on, waxy, tan-brown papules and/or plaques  - Benign-appearing - Discussed benign etiology and prognosis. - Observe - Call for any changes   Return in about 6  months (around 04/24/2022) for TBSE, Nail recheck.  I, Emelia Salisbury, CMA, am acting as scribe for [name], MD.  Documentation: I have reviewed the above documentation for accuracy and completeness, and I agree with the above.  [name], MD

## 2021-10-25 LAB — TSH+FREE T4
Free T4: 1.09 ng/dL (ref 0.82–1.77)
TSH: 1.85 u[IU]/mL (ref 0.450–4.500)

## 2021-11-04 ENCOUNTER — Encounter: Payer: Self-pay | Admitting: Dermatology

## 2021-11-17 ENCOUNTER — Other Ambulatory Visit: Payer: Self-pay | Admitting: Obstetrics and Gynecology

## 2021-11-17 DIAGNOSIS — N952 Postmenopausal atrophic vaginitis: Secondary | ICD-10-CM

## 2021-11-17 DIAGNOSIS — N390 Urinary tract infection, site not specified: Secondary | ICD-10-CM

## 2021-12-22 ENCOUNTER — Encounter: Payer: Self-pay | Admitting: Urology

## 2021-12-23 MED ORDER — CIPROFLOXACIN HCL 250 MG PO TABS: 250.0000 mg | ORAL_TABLET | Freq: Two times a day (BID) | ORAL | 0 refills | Status: AC

## 2022-01-02 ENCOUNTER — Other Ambulatory Visit: Payer: Self-pay | Admitting: Specialist

## 2022-01-02 ENCOUNTER — Other Ambulatory Visit: Payer: Self-pay

## 2022-01-02 DIAGNOSIS — J449 Chronic obstructive pulmonary disease, unspecified: Secondary | ICD-10-CM

## 2022-01-02 DIAGNOSIS — R918 Other nonspecific abnormal finding of lung field: Secondary | ICD-10-CM

## 2022-01-22 ENCOUNTER — Ambulatory Visit
Admission: RE | Admit: 2022-01-22 | Discharge: 2022-01-22 | Disposition: A | Discharge status: Home / Self Care | Payer: Managed Care, Other (non HMO) | Source: Ambulatory Visit | Attending: Specialist | Admitting: Specialist

## 2022-01-22 DIAGNOSIS — R918 Other nonspecific abnormal finding of lung field: Secondary | ICD-10-CM

## 2022-01-22 DIAGNOSIS — J449 Chronic obstructive pulmonary disease, unspecified: Secondary | ICD-10-CM

## 2022-02-01 ENCOUNTER — Other Ambulatory Visit: Payer: Self-pay | Admitting: Dermatology

## 2022-02-01 DIAGNOSIS — B351 Tinea unguium: Secondary | ICD-10-CM

## 2022-04-30 ENCOUNTER — Encounter: Payer: Self-pay | Admitting: Dermatology

## 2022-04-30 ENCOUNTER — Ambulatory Visit: Payer: Managed Care, Other (non HMO) | Admitting: Dermatology

## 2022-04-30 VITALS — BP 129/71 | HR 53

## 2022-04-30 DIAGNOSIS — L570 Actinic keratosis: Secondary | ICD-10-CM | POA: Diagnosis not present

## 2022-04-30 DIAGNOSIS — B351 Tinea unguium: Secondary | ICD-10-CM

## 2022-04-30 DIAGNOSIS — L814 Other melanin hyperpigmentation: Secondary | ICD-10-CM | POA: Diagnosis not present

## 2022-04-30 DIAGNOSIS — D492 Neoplasm of unspecified behavior of bone, soft tissue, and skin: Secondary | ICD-10-CM

## 2022-04-30 DIAGNOSIS — L853 Xerosis cutis: Secondary | ICD-10-CM

## 2022-04-30 DIAGNOSIS — D229 Melanocytic nevi, unspecified: Secondary | ICD-10-CM

## 2022-04-30 DIAGNOSIS — D485 Neoplasm of uncertain behavior of skin: Secondary | ICD-10-CM | POA: Diagnosis not present

## 2022-04-30 DIAGNOSIS — Z1283 Encounter for screening for malignant neoplasm of skin: Secondary | ICD-10-CM

## 2022-04-30 DIAGNOSIS — L821 Other seborrheic keratosis: Secondary | ICD-10-CM

## 2022-04-30 DIAGNOSIS — L578 Other skin changes due to chronic exposure to nonionizing radiation: Secondary | ICD-10-CM | POA: Diagnosis not present

## 2022-04-30 MED ORDER — FLUCONAZOLE 200 MG PO TABS: ORAL_TABLET | ORAL | 0 refills | Status: DC

## 2022-04-30 NOTE — Progress Notes (Signed)
Follow-Up Visit   Subjective  Debra Dean is a 62 y.o. female who presents for the following: Skin Cancer Screening and Full Body Skin Exam. No personal hx of skin cancer or dysplastic nevi  Recheck right great toenail. Took Fluconazole >1 year. Failed Terbinafine. Using Tavaborole solution at bedtime as directed and taking Fluconazole 200 mg once a week since last visit. Has noticed improvement   The patient presents for Total-Body Skin Exam (TBSE) for skin cancer screening and mole check. The patient has spots, moles and lesions to be evaluated, some may be new or changing and the patient has concerns that these could be cancer.    The following portions of the chart were reviewed this encounter and updated as appropriate: medications, allergies, medical history  Review of Systems:  No other skin or systemic complaints except as noted in HPI or Assessment and Plan.  Objective  Well appearing patient in no apparent distress; mood and affect are within normal limits.  A full examination was performed including scalp, head, eyes, ears, nose, lips, neck, chest, axillae, abdomen, back, buttocks, bilateral upper extremities, bilateral lower extremities, hands, feet, fingers, toes, fingernails, and toenails. All findings within normal limits unless otherwise noted below.    Left Breast 10 o'clock 0.5 cm scaly pink papule with glomerular vessels        Right great toe nail Proximal nail plate clearing but distal with subungual debris and discoloration  Left Forearm - Anterior x1 Erythematous thin papules/macules with gritty scale.     Assessment & Plan   Neoplasm of skin Left Breast 10 o'clock  Skin / nail biopsy Type of biopsy: tangential   Informed consent: discussed and consent obtained   Anesthesia: the lesion was anesthetized in a standard fashion   Anesthesia comment:  Area prepped with alcohol Anesthetic:  1% lidocaine w/ epinephrine 1-100,000 buffered w/ 8.4%  NaHCO3 Instrument used: flexible razor blade   Hemostasis achieved with: pressure, aluminum chloride and electrodesiccation   Outcome: patient tolerated procedure well   Post-procedure details: wound care instructions given   Post-procedure details comment:  Ointment and small bandage applied  Related Procedures Anatomic Pathology Report  Onychomycosis Right great toe nail  Chronic and persistent condition with duration or expected duration over one year. Condition is symptomatic/ bothersome to patient. Improving but not currently at goal.  Reviewed CBC and CMP from 01/2022. WNL.   Continue Kerydin solution every night at bedtime to affected toenails.   Continue Fluconazole 200mg  1 tablet once a week for 3 more months  Side effects of fluconazole (diflucan) include nausea, diarrhea, headache, dizziness, taste changes, rare risk of irritation of the liver, allergy, or decreased blood counts (which could show up as infection or tiredness).   Related Medications Tavaborole (KERYDIN) 5 % SOLN Apply QHS to affected toenail  fluconazole (DIFLUCAN) 200 MG tablet TAKE 1 TABLET BY MOUTH 1 TIME A WEEK AS DIRECTED  AK (actinic keratosis) Left Forearm - Anterior x1  Actinic keratoses are precancerous spots that appear secondary to cumulative UV radiation exposure/sun exposure over time. They are chronic with expected duration over 1 year. A portion of actinic keratoses will progress to squamous cell carcinoma of the skin. It is not possible to reliably predict which spots will progress to skin cancer and so treatment is recommended to prevent development of skin cancer.  Recommend daily broad spectrum sunscreen SPF 30+ to sun-exposed areas, reapply every 2 hours as needed.  Recommend staying in the shade or wearing  long sleeves, sun glasses (UVA+UVB protection) and wide brim hats (4-inch brim around the entire circumference of the hat). Call for new or changing lesions.  Destruction of  lesion - Left Forearm - Anterior x1  Destruction method: cryotherapy   Informed consent: discussed and consent obtained   Lesion destroyed using liquid nitrogen: Yes   Region frozen until ice ball extended beyond lesion: Yes   Outcome: patient tolerated procedure well with no complications   Post-procedure details: wound care instructions given   Additional details:  Prior to procedure, discussed risks of blister formation, small wound, skin dyspigmentation, or rare scar following cryotherapy. Recommend Vaseline ointment to treated areas while healing.    Lentigines - Scattered tan macules - Due to sun exposure - Benign-appearing, observe - Recommend daily broad spectrum sunscreen SPF 30+ to sun-exposed areas, reapply every 2 hours as needed. - Call for any changes  Seborrheic Keratoses - Stuck-on, waxy, tan-brown papules and/or plaques  - Benign-appearing - Discussed benign etiology and prognosis. - Observe - Call for any changes  Melanocytic Nevi - Tan-brown and/or pink-flesh-colored symmetric macules and papules - Benign appearing on exam today - Observation - Call clinic for new or changing moles - Recommend daily use of broad spectrum spf 30+ sunscreen to sun-exposed areas.   Hemangiomas - Red papules - Discussed benign nature - Observe - Call for any changes  Actinic Damage - Chronic condition, secondary to cumulative UV/sun exposure - diffuse scaly erythematous macules with underlying dyspigmentation - Recommend daily broad spectrum sunscreen SPF 30+ to sun-exposed areas, reapply every 2 hours as needed.  - Staying in the shade or wearing long sleeves, sun glasses (UVA+UVB protection) and wide brim hats (4-inch brim around the entire circumference of the hat) are also recommended for sun protection.  - Call for new or changing lesions.  Skin cancer screening performed today.  Xerosis - diffuse xerotic patches - recommend gentle, hydrating skin care - gentle  skin care handout given     Return in about 1 year (around 04/30/2023) for TBSE, Toenail recheck in 6 months.  I, Emelia Salisbury, MA scribed for Alfonso Patten, MD.  Documentation: I have reviewed the above documentation for accuracy and completeness, and I agree with the above.  [name], MD

## 2022-04-30 NOTE — Progress Notes (Deleted)
   Follow-Up Visit   Subjective  Debra Dean is a 62 y.o. female who presents for the following: Skin Cancer Screening and Full Body Skin Exam  The patient presents for Total-Body Skin Exam (TBSE) for skin cancer screening and mole check. The patient has spots, moles and lesions to be evaluated, some may be new or changing and the patient has concerns that these could be cancer.    The following portions of the chart were reviewed this encounter and updated as appropriate: medications, allergies, medical history  Review of Systems:  No other skin or systemic complaints except as noted in HPI or Assessment and Plan.  Objective  Well appearing patient in no apparent distress; mood and affect are within normal limits.  A full examination was performed including scalp, head, eyes, ears, nose, lips, neck, chest, axillae, abdomen, back, buttocks, bilateral upper extremities, bilateral lower extremities, hands, feet, fingers, toes, fingernails, and toenails. All findings within normal limits unless otherwise noted below.      Assessment & Plan    Lentigines - Scattered tan macules - Due to sun exposure - Benign-appearing, observe - Recommend daily broad spectrum sunscreen SPF 30+ to sun-exposed areas, reapply every 2 hours as needed. - Call for any changes  Seborrheic Keratoses - Stuck-on, waxy, tan-brown papules and/or plaques  - Benign-appearing - Discussed benign etiology and prognosis. - Observe - Call for any changes  Melanocytic Nevi - Tan-brown and/or pink-flesh-colored symmetric macules and papules - Benign appearing on exam today - Observation - Call clinic for new or changing moles - Recommend daily use of broad spectrum spf 30+ sunscreen to sun-exposed areas.   Hemangiomas - Red papules - Discussed benign nature - Observe - Call for any changes  Actinic Damage - Chronic condition, secondary to cumulative UV/sun exposure - diffuse scaly erythematous macules  with underlying dyspigmentation - Recommend daily broad spectrum sunscreen SPF 30+ to sun-exposed areas, reapply every 2 hours as needed.  - Staying in the shade or wearing long sleeves, sun glasses (UVA+UVB protection) and wide brim hats (4-inch brim around the entire circumference of the hat) are also recommended for sun protection.  - Call for new or changing lesions.  Skin cancer screening performed today.  No follow-ups on file.  Debra Dean as a Education administrator for [employer], MD.,have documented all relevant documentation on the behalf of Debra Gleason, MD,as directed by  Debra Gleason, MD while in the presence of Debra Gleason, MD.  Documentation: I have reviewed the above documentation for accuracy and completeness, and I agree with the above.  Debra Gleason, MD

## 2022-04-30 NOTE — Patient Instructions (Addendum)
Cryotherapy Aftercare  Wash gently with soap and water everyday.   Apply Vaseline and Band-Aid daily until healed.     Toenail: Continue Kerydin solution every night at bedtime to affected toenails.   Continue Fluconazole '200mg'$  1 tablet once a week for 3 more months  Side effects of fluconazole (diflucan) include nausea, diarrhea, headache, dizziness, taste changes, rare risk of irritation of the liver, allergy, or decreased blood counts (which could show up as infection or tiredness).   Wound Care Instructions  Cleanse wound gently with soap and water once a day then pat dry with clean gauze. Apply a thin coat of Petrolatum (petroleum jelly, "Vaseline") over the wound (unless you have an allergy to this). We recommend that you use a new, sterile tube of Vaseline. Do not pick or remove scabs. Do not remove the yellow or white "healing tissue" from the base of the wound.  Cover the wound with fresh, clean, nonstick gauze and secure with paper tape. You may use Band-Aids in place of gauze and tape if the wound is small enough, but would recommend trimming much of the tape off as there is often too much. Sometimes Band-Aids can irritate the skin.  You should call the office for your biopsy report after 1 week if you have not already been contacted.  If you experience any problems, such as abnormal amounts of bleeding, swelling, significant bruising, significant pain, or evidence of infection, please call the office immediately.  FOR ADULT SURGERY PATIENTS: If you need something for pain relief you may take 1 extra strength Tylenol (acetaminophen) AND 2 Ibuprofen ('200mg'$  each) together every 4 hours as needed for pain. (do not take these if you are allergic to them or if you have a reason you should not take them.) Typically, you may only need pain medication for 1 to 3 days.      Recommend daily broad spectrum sunscreen SPF 30+ to sun-exposed areas, reapply every 2 hours as needed. Call for  new or changing lesions.  Staying in the shade or wearing long sleeves, sun glasses (UVA+UVB protection) and wide brim hats (4-inch brim around the entire circumference of the hat) are also recommended for sun protection.    Recommend taking Heliocare sun protection supplement daily in sunny weather for additional sun protection. For maximum protection on the sunniest days, you can take up to 2 capsules of regular Heliocare OR take 1 capsule of Heliocare Ultra. For prolonged exposure (such as a full day in the sun), you can repeat your dose of the supplement 4 hours after your first dose. Heliocare can be purchased at [employer], at some [facility] or at [email or web address].    Melanoma ABCDEs  Melanoma is the most dangerous type of skin cancer, and is the leading cause of death from skin disease.  You are more likely to develop melanoma if you: Have light-colored skin, light-colored eyes, or red or blond hair Spend a lot of time in the sun Tan regularly, either outdoors or in a tanning bed Have had blistering sunburns, especially during childhood Have a close family member who has had a melanoma Have atypical moles or large birthmarks  Early detection of melanoma is key since treatment is typically straightforward and cure rates are extremely high if we catch it early.   The first sign of melanoma is often a change in a mole or a new dark spot.  The ABCDE system is a [place] of remembering the signs of melanoma.  A for  asymmetry:  The two halves do not match. B for border:  The edges of the growth are irregular. C for color:  A mixture of colors are present instead of an even brown color. D for diameter:  Melanomas are usually (but not always) greater than 66m - the size of a pencil eraser. E for evolution:  The spot keeps changing in size, shape, and color.  Please check your skin once per month between visits. You can use a small mirror in front and a large mirror behind you to keep  an eye on the back side or your body.   If you see any new or changing lesions before your next follow-up, please call to schedule a visit.  Please continue daily skin protection including broad spectrum sunscreen SPF 30+ to sun-exposed areas, reapplying every 2 hours as needed when you're outdoors.   Staying in the shade or wearing long sleeves, sun glasses (UVA+UVB protection) and wide brim hats (4-inch brim around the entire circumference of the hat) are also recommended for sun protection.     Gentle Skin Care Guide  1. Bathe no more than once a day.  2. Avoid bathing in hot water  3. Use a mild soap like Dove, Vanicream, Cetaphil, CeraVe. Can use Lever 2000 or Cetaphil antibacterial soap  4. Use soap only where you need it. On most days, use it under your arms, between your legs, and on your feet. Let the water rinse other areas unless visibly dirty.  5. When you get out of the bath/shower, use a towel to gently blot your skin dry, don't rub it.  6. While your skin is still a little damp, apply a moisturizing cream such as Vanicream, CeraVe, Cetaphil, Eucerin, Sarna lotion or plain Vaseline Jelly. For hands apply Neutrogena [place]Hand Cream or Excipial Hand Cream.  7. Reapply moisturizer any time you start to itch or feel dry.  8. Sometimes using free and clear laundry detergents can be helpful. Fabric softener sheets should be avoided. Downy Free & Gentle liquid, or any liquid fabric softener that is free of dyes and perfumes, it acceptable to use  9. If your doctor has given you prescription creams you may apply moisturizers over them       Due to recent changes in healthcare laws, you may see results of your pathology and/or laboratory studies on MyChart before the doctors have had a chance to review them. We understand that in some cases there may be results that are confusing or concerning to you. Please understand that not all results are received at the same time and  often the doctors may need to interpret multiple results in order to provide you with the best plan of care or course of treatment. Therefore, we ask that you please give u[place]2 business days to thoroughly review all your results before contacting the office for clarification. Should we see a critical lab result, you will be contacted sooner.   If You Need Anything After Your Visit  If you have any questions or concerns for your doctor, please call our main line at [phone] press option 4 to reach your doctor's medical assistant. If no one answers, please leave a voicemail as directed and we will return your call as soon as possible. Messages left after 4 pm will be answered the following business day.   You may also send [place] message via [place] We typically respond to MyChart messages within 1-2 business days.  For  prescription refills, please ask your pharmacy to contact our office. Our fax number is [phone].  If you have an urgent issue when the clinic is closed that cannot wait until the next business day, you can page your doctor at the number below.    Please note that while we do our best to be available for urgent issues outside of office hours, we are not available 24/7.   If you have an urgent issue and are unable to reach [place], you may choose to seek medical care at your doctor's office, retail clinic, urgent care center, or emergency room.  If you have a medical emergency, please immediately call 911 or go to the emergency department.  Pager Numbers  - Dr. Nehemiah Massed: [phone]  - Dr. Laurence Ferrari: [phone]  - Dr. Nicole Kindred: [phone]  In the event of inclement weather, please call our main line at [phone] for an update on the status of any delays or closures.  Dermatology Medication Tips: Please keep the boxes that topical medications come in in order to help keep track of the instructions about where and how to use these. Pharmacies typically print the  medication instructions only on the boxes and not directly on the medication tubes.   If your medication is too expensive, please contact our office at [phone] option 4 or send [place] a message through [place].   We are unable to tell what your co-pay for medications will be in advance as this is different depending on your insurance coverage. However, we may be able to find a substitute medication at lower cost or fill out paperwork to get insurance to cover a needed medication.   If a prior authorization is required to get your medication covered by your insurance company, please allow [place] 1-2 business days to complete this process.  Drug prices often vary depending on where the prescription is filled and some pharmacies may offer cheaper prices.  The website www.goodrx.com contains coupons for medications through different pharmacies. The prices here do not account for what the cost may be with help from insurance (it may be cheaper with your insurance), but the website can give you the price if you did not use any insurance.  - You can print the associated coupon and take it with your prescription to the pharmacy.  - You may also stop by our office during regular business hours and pick up a GoodRx coupon card.  - If you need your prescription sent electronically to a different pharmacy, notify our office through [facility] or by phone at [phone] option 4.     Si Usted Necesita Algo Despus de Su Visita  Tambin puede enviarnos un mensaje a travs de Pharmacist, community. Por lo general respondemos a los mensajes de MyChart en el transcurso de 1 a 2 das hbiles.  Para renovar recetas, por favor pida a su farmacia que se ponga en contacto con nuestra oficina. Harland Dingwall de fax es [place] [phone].  Si tiene un asunto urgente cuando la clnica est cerrada y que no puede esperar hasta el siguiente da hbil, puede llamar/localizar a su doctor(a) al nmero que aparece a continuacin.    Por favor, tenga en cuenta que aunque hacemos todo lo posible para estar disponibles para asuntos urgentes fuera del horario de [place], no estamos disponibles las 24 horas del da, los 7 das de la [place].   Si tiene un problema urgente y no puede comunicarse con nosotros, puede optar por buscar atencin mdica  en el  consultorio de su doctor(a), en una clnica privada, en un centro de atencin urgente o en una sala de emergencias.  Si tiene Engineering geologist, por favor llame inmediatamente al 911 o vaya a la sala de emergencias.  Nmeros de bper  - Dr. Nehemiah Massed: [phone]  - Dra. Moye: [phone]  - Dra. Nicole Kindred: [phone]  En caso de inclemencias del [place], por favor llame a Johnsie Kindred principal al [phone] para una actualizacin sobre el [place] de cualquier retraso o cierre.  Consejos para la medicacin en dermatologa: Por favor, guarde las cajas en las que vienen los medicamentos de uso tpico para ayudarle a seguir las instrucciones sobre dnde y cmo usarlos. Las farmacias generalmente imprimen las instrucciones del medicamento slo en las cajas y no directamente en los tubos del [place].   Si su medicamento es muy caro, por favor, pngase en contacto con Zigmund Daniel llamando al [phone] y presione la opcin 4 o envenos un mensaje a travs de Pharmacist, community.   No podemos decirle cul ser su copago por los medicamentos por adelantado ya que esto es diferente dependiendo de la cobertura de su seguro. Sin embargo, es posible que podamos encontrar un medicamento sustituto a Electrical engineer un formulario para que el seguro cubra el medicamento que se considera necesario.   Si se requiere una autorizacin previa para que su compaa de seguros [place] su medicamento, por favor permtanos de 1 a 2 das hbiles para completar este proceso.  Los precios de los medicamentos varan con frecuencia dependiendo del Environmental consultant de dnde se surte la receta y alguna  farmacias pueden ofrecer precios ms baratos.  El sitio web www.goodrx.com tiene cupones para medicamentos de Airline pilot. Los precios aqu no tienen en cuenta lo que podra costar con la ayuda del seguro (puede ser ms barato con su seguro), pero el sitio web puede darle el precio si no utiliz Research scientist (physical sciences).  - Puede imprimir el cupn correspondiente y llevarlo con su receta a la farmacia.  - Tambin puede pasar por nuestra oficina durante el horario de atencin regular y Charity fundraiser una tarjeta de cupones de GoodRx.  - Si necesita que su receta se enve electrnicamente a una farmacia diferente, informe a nuestra oficina a travs de MyChart de [place] o por telfono llamando al [phone] y presione la opcin 4.

## 2022-05-05 ENCOUNTER — Encounter: Payer: Self-pay | Admitting: Dermatology

## 2022-05-08 ENCOUNTER — Telehealth: Payer: Self-pay

## 2022-05-08 LAB — ANATOMIC PATHOLOGY REPORT

## 2022-05-08 NOTE — Telephone Encounter (Signed)
Patient advised pathology showed AK at left breast. Patient prefers LN2 in clinic, scheduled.  Lurlean Horns., RMA

## 2022-05-08 NOTE — Telephone Encounter (Signed)
-----   Message from [place], MD sent at 05/08/2022 12:17 PM EDT ----- Comment: Part A-left breast 10:00,Skin Biopsy: ACTINIC KERATOSIS. --> LN2 in clinic or can send in 5FU cream to use twice a day for 2 weeks (wait until healed to start cream if she prefers cream treatment). 5FU cream can cause redness, crusting and irritation wherever there are precancer cells. The area then heals up in the week after finishing treatment. If she opts for cream, we can send the full instructions to her MyChart.   MAs please call. Thank you!

## 2022-05-20 ENCOUNTER — Encounter: Payer: Self-pay | Admitting: Dermatology

## 2022-05-20 ENCOUNTER — Ambulatory Visit: Payer: Managed Care, Other (non HMO) | Admitting: Dermatology

## 2022-05-20 VITALS — BP 129/71

## 2022-05-20 DIAGNOSIS — L821 Other seborrheic keratosis: Secondary | ICD-10-CM

## 2022-05-20 DIAGNOSIS — L578 Other skin changes due to chronic exposure to nonionizing radiation: Secondary | ICD-10-CM

## 2022-05-20 DIAGNOSIS — L570 Actinic keratosis: Secondary | ICD-10-CM | POA: Diagnosis not present

## 2022-05-20 NOTE — Progress Notes (Signed)
   Follow-Up Visit   Subjective  Debra Dean is a 62 y.o. female who presents for the following: Actinic keratosis  Biopsy proven at left breast. Patient here today for LN2. She also has a spot at her right arm.   The following portions of the chart were reviewed this encounter and updated as appropriate: medications, allergies, medical history  Review of Systems:  No other skin or systemic complaints except as noted in HPI or Assessment and Plan.  Objective  Well appearing patient in no apparent distress; mood and affect are within normal limits.  A focused examination was performed of the following areas: Left breast, arms  Relevant exam findings are noted in the Assessment and Plan.   Assessment & Plan   ACTINIC KERATOSIS  Exam: Erythematous thin papules/macules with gritty scale  Actinic keratoses are precancerous spots that appear secondary to cumulative UV radiation exposure/sun exposure over time. They are chronic with expected duration over 1 year. A portion of actinic keratoses will progress to squamous cell carcinoma of the skin. It is not possible to reliably predict which spots will progress to skin cancer and so treatment is recommended to prevent development of skin cancer.  Recommend daily broad spectrum sunscreen SPF 30+ to sun-exposed areas, reapply every 2 hours as needed.  Recommend staying in the shade or wearing long sleeves, sun glasses (UVA+UVB protection) and wide brim hats (4-inch brim around the entire circumference of the hat). Call for new or changing lesions.  Treatment Plan: Destruction Procedure Note Destruction method: cryotherapy   Informed consent: discussed and consent obtained   Lesion destroyed using liquid nitrogen: Yes   Outcome: patient tolerated procedure well with no complications   Post-procedure details: wound care instructions given   Locations: left breast # of Lesions Treated: 1  Prior to procedure, discussed risks of blister  formation, small wound, skin dyspigmentation, or rare scar following cryotherapy. Recommend Vaseline ointment to treated areas while healing.  SEBORRHEIC KERATOSIS - Stuck-on, waxy, tan-brown papules and/or plaques  - Benign-appearing - Discussed benign etiology and prognosis. - Observe - Call for any changes  ACTINIC DAMAGE - chronic, secondary to cumulative UV radiation exposure/sun exposure over time - diffuse scaly erythematous macules with underlying dyspigmentation - Recommend daily broad spectrum sunscreen SPF 30+ to sun-exposed areas, reapply every 2 hours as needed.  - Recommend staying in the shade or wearing long sleeves, sun glasses (UVA+UVB protection) and wide brim hats (4-inch brim around the entire circumference of the hat). - Call for new or changing lesions. - Recommend taking Heliocare sun protection supplement daily in sunny weather for additional sun protection. For maximum protection on the sunniest days, you can take up to 2 capsules of regular Heliocare OR take 1 capsule of Heliocare Ultra.    Return for as scheduled to recheck nail.  Anise Salvo, RMA, am acting as scribe for Darden Dates, MD .  Documentation: I have reviewed the above documentation for accuracy and completeness, and I agree with the above.  Darden Dates, MD

## 2022-05-20 NOTE — Patient Instructions (Addendum)
Cryotherapy Aftercare  Wash gently with soap and water everyday.   Apply Vaseline and Band-Aid daily until healed.    Recommend taking Heliocare sun protection supplement daily in sunny weather for additional sun protection. For maximum protection on the sunniest days, you can take up to 2 capsules of regular Heliocare OR take 1 capsule of Heliocare Ultra. For prolonged exposure (such as a full day in the sun), you can repeat your dose of the supplement 4 hours after your first dose. Heliocare can be purchased at [facility], at some [facility] or at www.[email or web address].      Due to recent changes in healthcare laws, you may see results of your pathology and/or laboratory studies on MyChart before the doctors have had a chance to review them. We understand that in some cases there may be results that are confusing or concerning to you. Please understand that not all results are received at the same time and often the doctors may need to interpret multiple results in order to provide you with the best plan of care or course of treatment. Therefore, we ask that you please give us 2 business days to thoroughly review all your results before contacting the office for clarification. Should we see a critical lab result, you will be contacted sooner.   If You Need Anything After Your Visit  If you have any questions or concerns for your doctor, please call our main line at [phone] and press option 4 to reach your doctor's medical assistant. If no one answers, please leave a voicemail as directed and we will return your call as soon as possible. Messages left after 4 pm will be answered the following business day.   You may also send us a message via MyChart. We typically respond to MyChart messages within 1-2 business days.  For prescription refills, please ask your pharmacy to contact our office. Our fax number is [phone].  If you have an urgent issue when the clinic is closed that  cannot wait until the next business day, you can page your doctor at the number below.    Please note that while we do our best to be available for urgent issues outside of office hours, we are not available 24/7.   If you have an urgent issue and are unable to reach us, you may choose to seek medical care at your doctor's office, retail clinic, urgent care center, or emergency room.  If you have a medical emergency, please immediately call 911 or go to the emergency department.  Pager Numbers  - Dr. Kowalski: [phone]  - Dr. Moye: [phone]  - Dr. Stewart: [phone]  In the event of inclement weather, please call our main line at [phone] for an update on the status of any delays or closures.  Dermatology Medication Tips: Please keep the boxes that topical medications come in in order to help keep track of the instructions about where and how to use these. Pharmacies typically print the medication instructions only on the boxes and not directly on the medication tubes.   If your medication is too expensive, please contact our office at [phone] option 4 or send us a message through MyChart.   We are unable to tell what your co-pay for medications will be in advance as this is different depending on your insurance coverage. However, we may be able to find a substitute medication at lower cost or fill out paperwork to get insurance to cover a needed medication.   If   a prior authorization is required to get your medication covered by your insurance company, please allow us 1-2 business days to complete this process.  Drug prices often vary depending on where the prescription is filled and some pharmacies may offer cheaper prices.  The website www.goodrx.com contains coupons for medications through different pharmacies. The prices here do not account for what the cost may be with help from insurance (it may be cheaper with your insurance), but the website can give you the  price if you did not use any insurance.  - You can print the associated coupon and take it with your prescription to the pharmacy.  - You may also stop by our office during regular business hours and pick up a GoodRx coupon card.  - If you need your prescription sent electronically to a different pharmacy, notify our office through [place] MyChart or by phone at [phone] option 4.     Si Usted Necesita Algo Despus de Su Visita  Tambin puede enviarnos un mensaje a travs de MyChart. Por lo general respondemos a los mensajes de MyChart en el transcurso de 1 a 2 das hbiles.  Para renovar recetas, por favor pida a su farmacia que se ponga en contacto con nuestra oficina. Nuestro nmero de fax es el [phone].  Si tiene un asunto urgente cuando la clnica est cerrada y que no puede esperar hasta el siguiente da hbil, puede llamar/localizar a su doctor(a) al nmero que aparece a continuacin.   Por favor, tenga en cuenta que aunque hacemos todo lo posible para estar disponibles para asuntos urgentes fuera del horario de oficina, no estamos disponibles las 24 horas del da, los 7 das de la semana.   Si tiene un problema urgente y no puede comunicarse con nosotros, puede optar por buscar atencin mdica  en el consultorio de su doctor(a), en una clnica privada, en un centro de atencin urgente o en una sala de emergencias.  Si tiene una emergencia mdica, por favor llame inmediatamente al 911 o vaya a la sala de emergencias.  Nmeros de bper  - Dr. Kowalski: [phone]  - Dra. Moye: [phone]  - Dra. Stewart: [phone]  En caso de inclemencias del tiempo, por favor llame a nuestra lnea principal al [phone] para una actualizacin sobre el estado de cualquier retraso o cierre.  Consejos para la medicacin en dermatologa: Por favor, guarde las cajas en las que vienen los medicamentos de uso tpico para ayudarle a seguir las instrucciones sobre dnde y cmo  usarlos. Las farmacias generalmente imprimen las instrucciones del medicamento slo en las cajas y no directamente en los tubos del medicamento.   Si su medicamento es muy caro, por favor, pngase en contacto con nuestra oficina llamando al [phone] y presione la opcin 4 o envenos un mensaje a travs de MyChart.   No podemos decirle cul ser su copago por los medicamentos por adelantado ya que esto es diferente dependiendo de la cobertura de su seguro. Sin embargo, es posible que podamos encontrar un medicamento sustituto a menor costo o llenar un formulario para que el seguro cubra el medicamento que se considera necesario.   Si se requiere una autorizacin previa para que su compaa de seguros cubra su medicamento, por favor permtanos de 1 a 2 das hbiles para completar este proceso.  Los precios de los medicamentos varan con frecuencia dependiendo del lugar de dnde se surte la receta y alguna farmacias pueden ofrecer precios ms baratos.  El sitio web www.goodrx.com tiene   cupones para medicamentos de diferentes farmacias. Los precios aqu no tienen en cuenta lo que podra costar con la ayuda del seguro (puede ser ms barato con su seguro), pero el sitio web puede darle el precio si no utiliz ningn seguro.  - Puede imprimir el cupn correspondiente y llevarlo con su receta a la farmacia.  - Tambin puede pasar por nuestra oficina durante el horario de atencin regular y recoger una tarjeta de cupones de GoodRx.  - Si necesita que su receta se enve electrnicamente a una farmacia diferente, informe a nuestra oficina a travs de MyChart de [place] o por telfono llamando al [phone] y presione la opcin 4.  

## 2022-05-21 ENCOUNTER — Other Ambulatory Visit: Payer: Self-pay | Admitting: Specialist

## 2022-05-21 DIAGNOSIS — R918 Other nonspecific abnormal finding of lung field: Secondary | ICD-10-CM

## 2022-05-26 ENCOUNTER — Ambulatory Visit
Admission: RE | Admit: 2022-05-26 | Discharge: 2022-05-26 | Disposition: A | Discharge status: Home / Self Care | Payer: Managed Care, Other (non HMO) | Source: Ambulatory Visit | Attending: Specialist | Admitting: Specialist

## 2022-05-26 DIAGNOSIS — R918 Other nonspecific abnormal finding of lung field: Secondary | ICD-10-CM

## 2022-07-03 ENCOUNTER — Ambulatory Visit (INDEPENDENT_AMBULATORY_CARE_PROVIDER_SITE_OTHER): Payer: Managed Care, Other (non HMO)

## 2022-07-03 DIAGNOSIS — K2950 Unspecified chronic gastritis without bleeding: Secondary | ICD-10-CM

## 2022-07-03 DIAGNOSIS — Z86010 Personal history of colonic polyps: Secondary | ICD-10-CM

## 2022-07-03 DIAGNOSIS — K640 First degree hemorrhoids: Secondary | ICD-10-CM

## 2022-07-03 DIAGNOSIS — K449 Diaphragmatic hernia without obstruction or gangrene: Secondary | ICD-10-CM

## 2022-07-03 DIAGNOSIS — Z09 Encounter for follow-up examination after completed treatment for conditions other than malignant neoplasm: Secondary | ICD-10-CM | POA: Diagnosis not present

## 2022-07-25 ENCOUNTER — Other Ambulatory Visit: Payer: Self-pay | Admitting: Family Medicine

## 2022-07-25 DIAGNOSIS — Z1231 Encounter for screening mammogram for malignant neoplasm of breast: Secondary | ICD-10-CM

## 2022-08-15 ENCOUNTER — Ambulatory Visit
Admission: RE | Admit: 2022-08-15 | Discharge: 2022-08-15 | Disposition: A | Discharge status: Home / Self Care | Payer: Managed Care, Other (non HMO) | Source: Ambulatory Visit | Attending: Family Medicine | Admitting: Family Medicine

## 2022-08-15 DIAGNOSIS — Z1231 Encounter for screening mammogram for malignant neoplasm of breast: Secondary | ICD-10-CM | POA: Diagnosis present

## 2022-09-28 IMAGING — MG MM DIGITAL SCREENING BILAT W/ TOMO AND CAD
6 of 10 series · 6 of 30 positions shown · non-contrast
Comparison: Previous exam(s).

CLINICAL DATA: Screening.

EXAM:
DIGITAL SCREENING BILATERAL MAMMOGRAM WITH TOMOSYNTHESIS AND CAD
TECHNIQUE: Bilateral screening digital craniocaudal and mediolateral oblique
mammograms were obtained. Bilateral screening digital breast
tomosynthesis was performed. The images were evaluated with
computer-aided detection.

[R MLO synth-2D (1 of 2)]
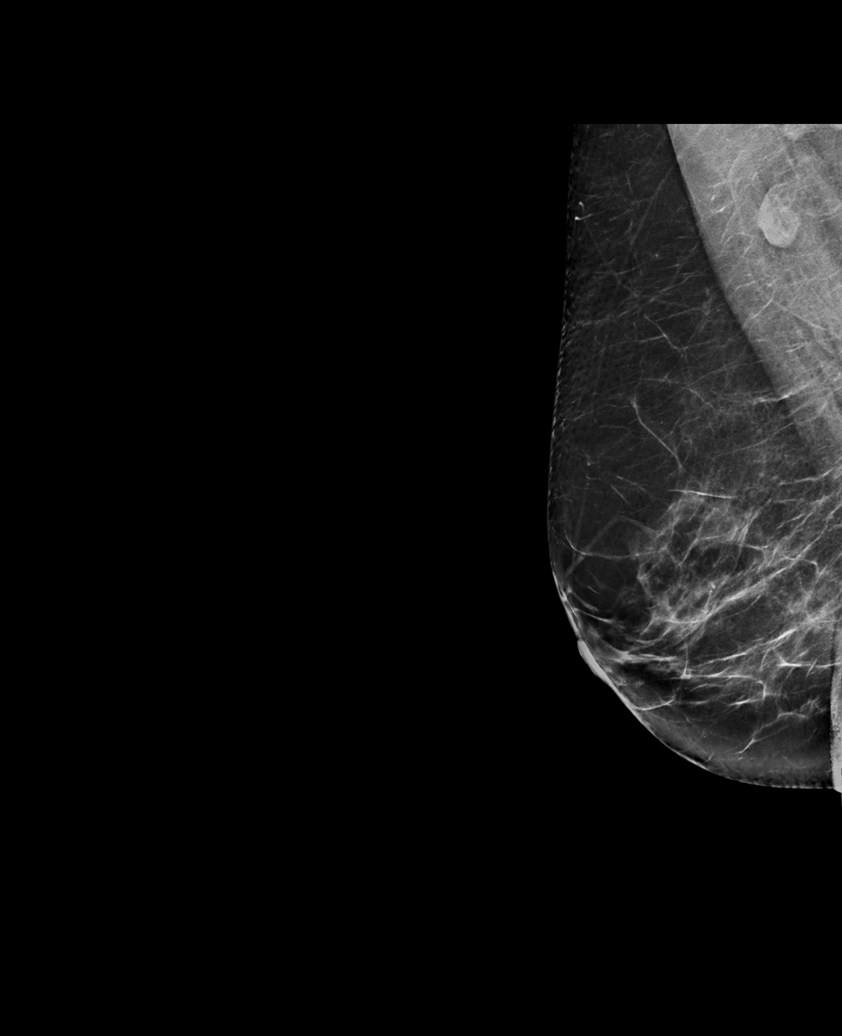

[L MLO synth-2D]
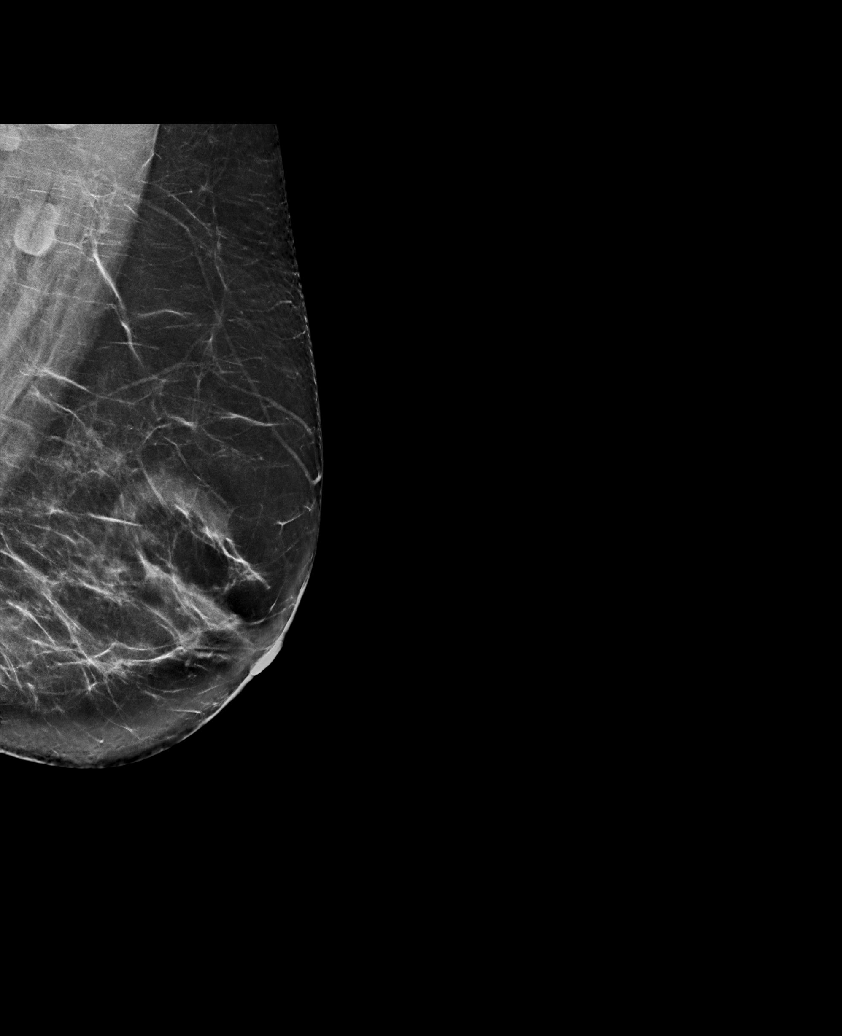

[R MLO synth-2D (2 of 2)]
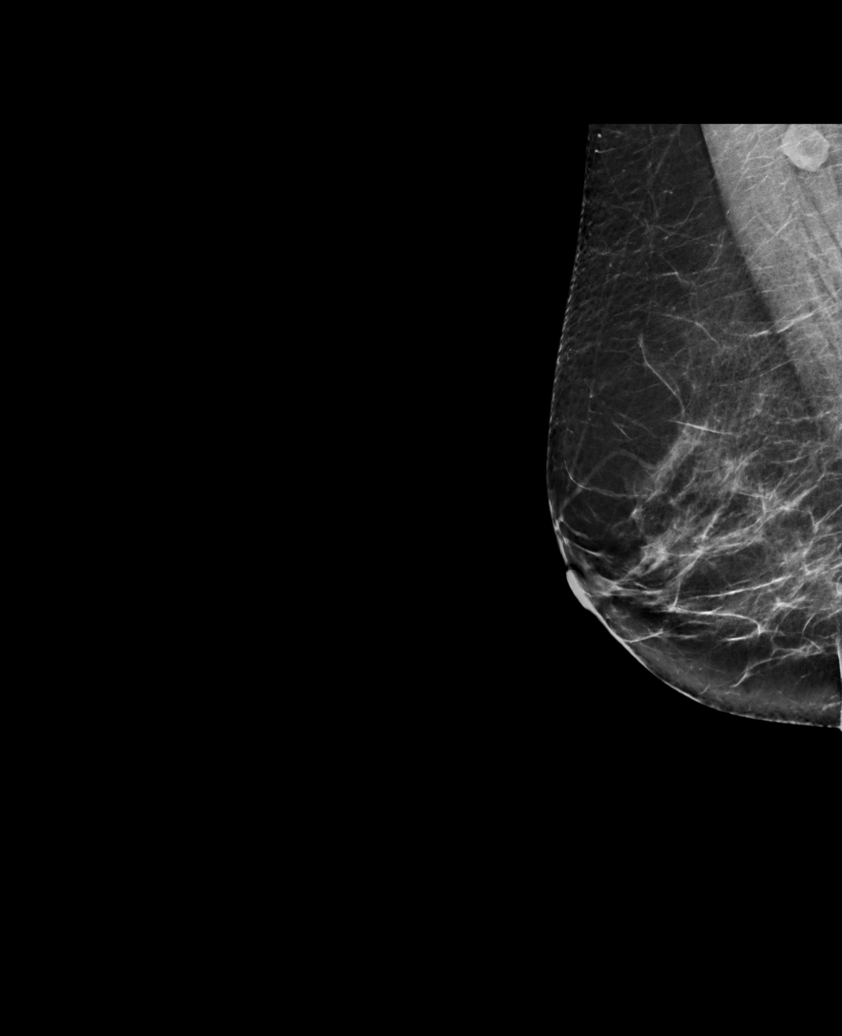

[R CC synth-2D]
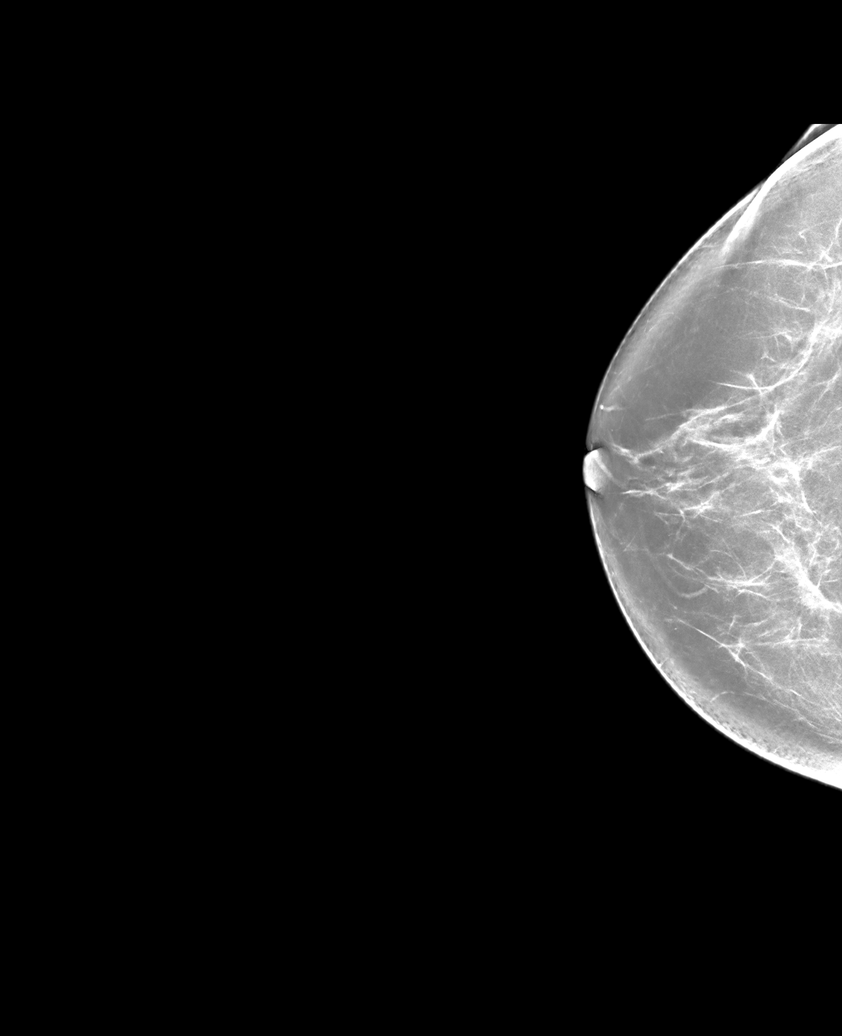

[L CC synth-2D]
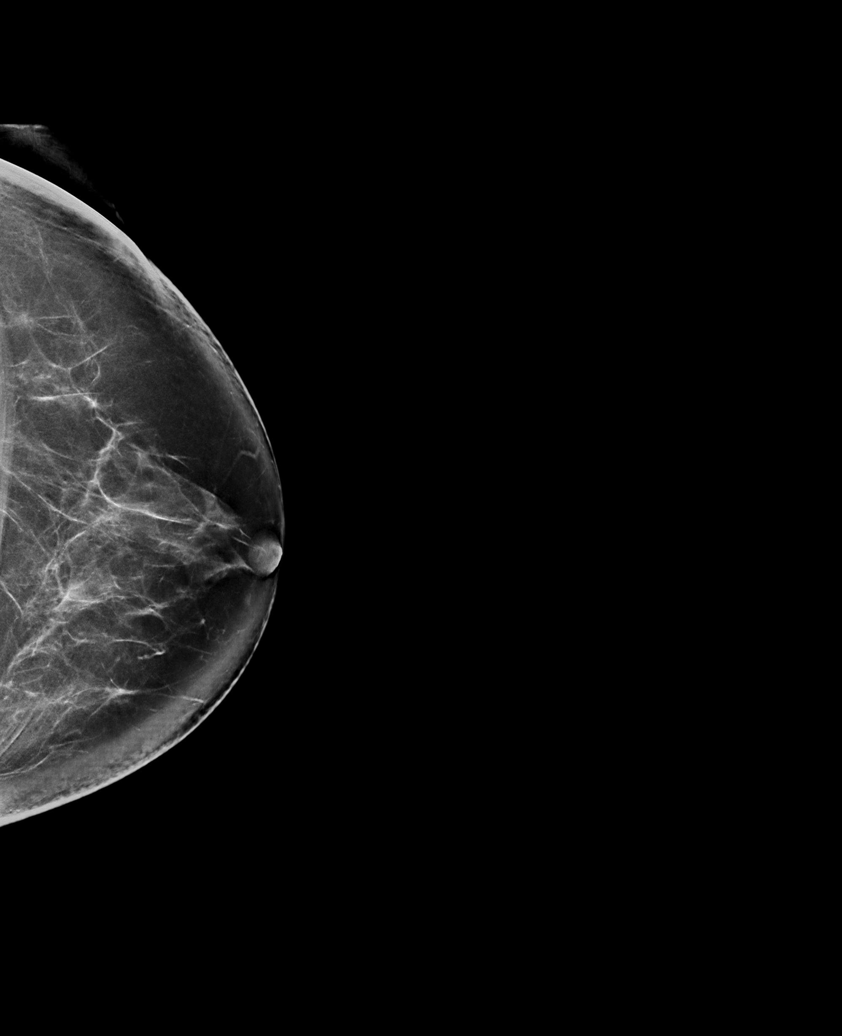

[R MLO tomo · tomo slice 39/78.0]
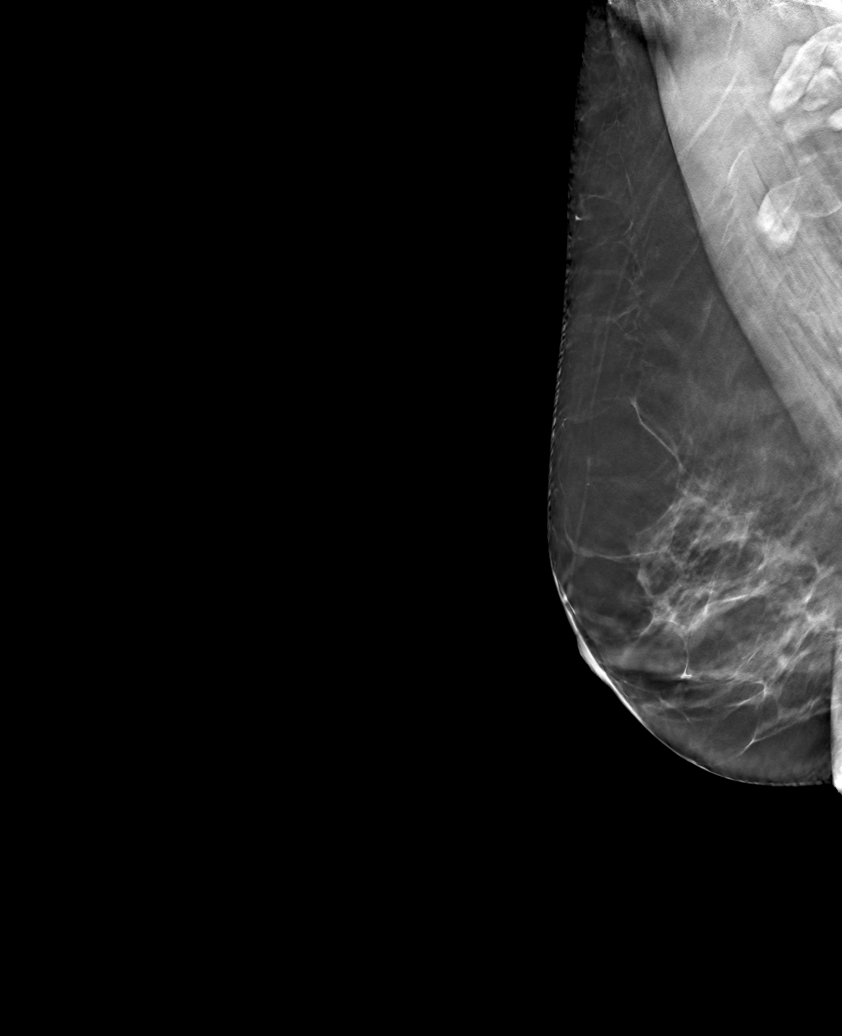

[6 of 30 positions shown; findings below may reference images not displayed]

ACR Breast Density Category b: There are scattered areas of
fibroglandular density.
FINDINGS: There are no findings suspicious for malignancy. The images were
evaluated with computer-aided detection.
IMPRESSION: No mammographic evidence of malignancy. A result letter of this
screening mammogram will be mailed directly to the patient.

RECOMMENDATION:
Screening mammogram in one year. (Code:WJ-I-BG6)

BI-RADS CATEGORY  1: Negative.

## 2022-11-06 ENCOUNTER — Ambulatory Visit: Payer: Managed Care, Other (non HMO) | Admitting: Dermatology

## 2022-11-06 DIAGNOSIS — L601 Onycholysis: Secondary | ICD-10-CM

## 2022-11-06 DIAGNOSIS — B351 Tinea unguium: Secondary | ICD-10-CM | POA: Diagnosis not present

## 2022-11-06 DIAGNOSIS — L234 Allergic contact dermatitis due to dyes: Secondary | ICD-10-CM

## 2022-11-06 DIAGNOSIS — W908XXA Exposure to other nonionizing radiation, initial encounter: Secondary | ICD-10-CM

## 2022-11-06 DIAGNOSIS — L570 Actinic keratosis: Secondary | ICD-10-CM

## 2022-11-06 DIAGNOSIS — L820 Inflamed seborrheic keratosis: Secondary | ICD-10-CM | POA: Diagnosis not present

## 2022-11-06 DIAGNOSIS — L659 Nonscarring hair loss, unspecified: Secondary | ICD-10-CM

## 2022-11-06 DIAGNOSIS — Z7189 Other specified counseling: Secondary | ICD-10-CM

## 2022-11-06 MED ORDER — TAVABOROLE 5 % EX SOLN: CUTANEOUS | 11 refills | Status: AC

## 2022-11-06 NOTE — Progress Notes (Signed)
Follow-Up Visit   Subjective  Debra Dean is a 62 y.o. female who presents for the following: Patient here for 3 month follow up on the right toenail treating with Fluconazole 200 mg tablet once a week and applying Kerydin solution at bedtime with a good response.   The patient has spots on the forehead, right hairline and left leg to be evaluated, some may be new or changing and the patient may have concern these could be cancer.   The following portions of the chart were reviewed this encounter and updated as appropriate: medications, allergies, medical history  Review of Systems:  No other skin or systemic complaints except as noted in HPI or Assessment and Plan.  Objective  Well appearing patient in no apparent distress; mood and affect are within normal limits.  A focused examination was performed of the following areas: face, toenails, fingernails, left leg   Relevant exam findings are noted in the Assessment and Plan.  right forehead x 1 Erythematous thin papules/macules with gritty scale.   left medial thigh x 1, right temporal scalp x 1, (2) Stuck-on, waxy, tan-brown papule --Discussed benign etiology and prognosis.    Assessment & Plan   Onychomycosis of toenails Failed terbinafine  Exam: distal first and second bilateral nail plates with onychauxis/discoloration from past onychomycosis. proximal nail plates with clearing  Chronic and persistent condition with duration or expected duration over one year. Condition is symptomatic/ bothersome to patient. Improving but not currently at goal.  Plan: Continue Kerydin solution every night at bedtime to affected toenails. 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 It does not progress to baldness, and the hair eventually grows back on its own. It can be triggered by recent illness, recent surgery, thyroid disease, low iron stores, vitamin D deficiency, fad diets or rapid weight loss, hormonal changes such as pregnancy or birth control pills, and some medication. Usually the hair loss starts 2-3 months after the illness or health change. Rarely, it can continue for longer than a year. Treatments options may include oral or topical Minoxidil; Red Light scalp treatments; Biotin 2.5 mg daily and other options.  Treatment Plan: Patient reports scalp itching x a few days after each hair dyeing session. Suspicious for allergy PPD is common allergen in hair dye. Request an option that doesn't contain this. May be exacerbating hair loss To evaluate for telogen effluvium: patient will request labs from her PCP LABS FOR DR LINTHAVONG: CBC with differential, ferritin, TSH, free T4, vitamin D 25-hydroxy  Onycholysis of fingernails  Exam: distal onycholysis of Left 4th and left 5th finger nails   Plan: Avoid trauma to nail  Assessment & Plan   AK (actinic keratosis) right forehead x 1  Actinic keratoses are precancerous spots that appear secondary to cumulative UV radiation exposure/sun exposure over time. They are chronic with expected duration over 1 year. A portion of actinic keratoses will progress to squamous cell carcinoma of the skin. It is not possible to reliably predict which spots will progress to skin cancer and so treatment is recommended to prevent development of skin cancer.  Recommend daily broad spectrum sunscreen SPF 30+ to sun-exposed areas, reapply  every 2 hours as needed.  Recommend staying in the shade or wearing long sleeves, sun glasses (UVA+UVB protection) and wide brim hats (4-inch brim around the entire circumference of the hat). Call for new or  changing lesions.   Destruction of lesion - right forehead x 1 Complexity: simple   Destruction method: cryotherapy   Informed consent: discussed and consent obtained   Timeout:  patient name, date of birth, surgical site, and procedure verified Lesion destroyed using liquid nitrogen: Yes   Region frozen until ice ball extended beyond lesion: Yes   Outcome: patient tolerated procedure well with no complications   Post-procedure details: wound care instructions given    Inflamed seborrheic keratosis (2) left medial thigh x 1, right temporal scalp x 1,  Symptomatic, irritating, patient would like treated.   Destruction of lesion - left medial thigh x 1, right temporal scalp x 1, (2) Complexity: simple   Destruction method: cryotherapy   Informed consent: discussed and consent obtained   Timeout:  patient name, date of birth, surgical site, and procedure verified Lesion destroyed using liquid nitrogen: Yes   Region frozen until ice ball extended beyond lesion: Yes   Outcome: patient tolerated procedure well with no complications   Post-procedure details: wound care instructions given    Onychomycosis  Related Medications fluconazole (DIFLUCAN) 200 MG tablet TAKE 1 TABLET BY MOUTH 1 TIME A WEEK AS DIRECTED  Tavaborole (KERYDIN) 5 % SOLN Apply QHS to affected toenail    Return for scheduled TBSE May 07, 2023.  IAngelique Holm, CMA, am acting as scribe for Elie Goody, MD .   Documentation: I have reviewed the above documentation for accuracy and completeness, and I agree with the above.  Elie Goody, MD

## 2022-11-06 NOTE — Patient Instructions (Addendum)
Paraphenylenediamine (PPD) allergen in hair dye  LABS FOR DR LINTHAVONG: CBC with differential, ferritin, TSH, free T4, vitamin D 25-hydroxy  Cryotherapy Aftercare  Wash gently with soap and water everyday.   Apply Vaseline and Band-Aid daily until healed.     [facility] - [place], [place] - [address] Jacklynn Lewis [place] [address] Phone: [phone]  Fax: [phone]      Due to recent changes in healthcare laws, you may see results of your pathology and/or laboratory studies on MyChart before the doctors have had a chance to review them. We understand that in some cases there may be results that are confusing or concerning to you. Please understand that not all results are received at the same time and often the doctors may need to interpret multiple results in order to provide you with the best plan of care or course of treatment. Therefore, we ask that you please give [place] 2 business days to thoroughly review all your results before contacting the office for clarification. Should we see a critical lab result, you will be contacted sooner.   If You Need Anything After Your Visit  If you have any questions or concerns for your doctor, please call our main line at [phone] and press option 4 to reach your doctor's medical assistant. If no one answers, please leave a voicemail as directed and we will return your call as soon as possible. Messages left after 4 pm will be answered the following business day.   You may also send [place] a message via MyChart. We typically respond to MyChart messages within 1-2 business days.  For prescription refills, please ask your pharmacy to contact our office. Our fax number is [phone].  If you have an urgent issue when the clinic is closed that cannot wait until the next business day, you can page your doctor at the number below.    Please note that while we do our best to be available for urgent issues outside  of office hours, we are not available 24/7.   If you have an urgent issue and are unable to reach [place], you may choose to seek medical care at your doctor's office, retail clinic, urgent care center, or emergency room.  If you have a medical emergency, please immediately call 911 or go to the emergency department.  Pager Numbers  - Dr. Gwen Pounds: [phone]  - Dr. Roseanne Reno: [phone]  - Dr. Katrinka Blazing: [phone]   In the event of inclement weather, please call our main line at [phone] for an update on the status of any delays or closures.  Dermatology Medication Tips: Please keep the boxes that topical medications come in in order to help keep track of the instructions about where and how to use these. Pharmacies typically print the medication instructions only on the boxes and not directly on the medication tubes.   If your medication is too expensive, please contact our office at [phone] option 4 or send [place] a message through MyChart.   We are unable to tell what your co-pay for medications will be in advance as this is different depending on your insurance coverage. However, we may be able to find a substitute medication at lower cost or fill out paperwork to get insurance to cover a needed medication.   If a prior authorization is required to get your medication covered by your insurance company, please allow [place] 1-2 business days to complete this process.  Drug prices  often vary depending on where the prescription is filled and some pharmacies may offer cheaper prices.  The website www.goodrx.com contains coupons for medications through different pharmacies. The prices here do not account for what the cost may be with help from insurance (it may be cheaper with your insurance), but the website can give you the price if you did not use any insurance.  - You can print the associated coupon and take it with your prescription to the pharmacy.  - You may also stop by our office  during regular business hours and pick up a GoodRx coupon card.  - If you need your prescription sent electronically to a different pharmacy, notify our office through [facility] or by phone at [phone] option 4.     Si Usted Necesita Algo Despus de Su Visita  Tambin puede enviarnos un mensaje a travs de Clinical cytogeneticist. Por lo general respondemos a los mensajes de MyChart en el transcurso de 1 a 2 das hbiles.  Para renovar recetas, por favor pida a su farmacia que se ponga en contacto con nuestra oficina. Annie Sable de fax es [place] [phone].  Si tiene un asunto urgente cuando la clnica est cerrada y que no puede esperar hasta el siguiente da hbil, puede llamar/localizar a su doctor(a) al nmero que aparece a continuacin.   Por favor, tenga en cuenta que aunque hacemos todo lo posible para estar disponibles para asuntos urgentes fuera del horario de [place], no estamos disponibles las 24 horas del da, los 7 [address]  [address] de la [place].   Si tiene un problema urgente y no puede comunicarse con nosotros, puede optar por buscar atencin mdica  en el consultorio de su doctor(a), en una clnica privada, en un centro de atencin urgente o en una sala de emergencias.  Si tiene Engineer, drilling, por favor llame inmediatamente al 911 o vaya a la sala de emergencias.  Nmeros de bper  - Dr. Gwen Pounds: [phone]  - Dra. Roseanne Reno: [phone]  - Dr. Katrinka Blazing: [phone]   En caso de inclemencias del tiempo, por favor llame a Lacy Duverney principal al [phone] para una actualizacin sobre el [place] de cualquier retraso o cierre.  Consejos para la medicacin en dermatologa: Por favor, guarde las cajas en las que vienen los medicamentos de uso tpico para ayudarle a seguir las instrucciones sobre dnde y cmo usarlos. Las farmacias generalmente imprimen las instrucciones del medicamento slo en las cajas y no directamente en los tubos del [place].   Si su medicamento es  muy caro, por favor, pngase en contacto con Rolm Gala llamando al [phone] y presione la opcin 4 o envenos un mensaje a travs de Clinical cytogeneticist.   No podemos decirle cul ser su copago por los medicamentos por adelantado ya que esto es diferente dependiendo de la cobertura de su seguro. Sin embargo, es posible que podamos encontrar un medicamento sustituto a Audiological scientist un formulario para que el seguro cubra el medicamento que se considera necesario.   Si se requiere una autorizacin previa para que su compaa de seguros [place] su medicamento, por favor permtanos de 1 a 2 das hbiles para completar [address]  Los precios de los medicamentos varan con frecuencia dependiendo del Environmental consultant de dnde se surte la receta y alguna farmacias pueden ofrecer precios ms baratos.  El sitio web www.goodrx.com tiene cupones para medicamentos de Health and safety inspector. Los precios aqu no tienen en cuenta lo que podra costar con la ayuda del seguro (puede ser ms barato  con su seguro), pero el sitio web puede darle el precio si no Visual merchandiser.  - Puede imprimir el cupn correspondiente y llevarlo con su receta a la farmacia.  - Tambin puede pasar por nuestra oficina durante el horario de atencin regular y Education officer, museum una tarjeta de cupones de GoodRx.  - Si necesita que su receta se enve electrnicamente a una farmacia diferente, informe a nuestra oficina a travs de MyChart de [place] o por telfono llamando al [phone] y presione la opcin 4.

## 2022-11-07 ENCOUNTER — Encounter: Payer: Self-pay | Admitting: Dermatology

## 2022-11-20 NOTE — Progress Notes (Signed)
Chief Complaint  Patient presents with   Gynecologic Exam    No concerns    HPI:      Debra Dean is a 62 y.o. G1P1 who LMP was No LMP recorded. Patient has had a hysterectomy., presents today for her annual examination.  Her menses are absent due to hysterectomy. She does not have PMB.  She has rare vasomotor sx.   Occas still has irritation in perianal area, but has hemorrhoids. Has some ext vaginal irritation as well at times. Improved with lotrisone crm prn. Needs RF. Tries to keep area dry.    Sex activity: single partner, contraception - status post hysterectomy. She does have vaginal dryness, occas small amount of red blood with sex, improved with lubricants. She can't tolerate oral, topical or patch ERT due to headaches. Rxd vaginal ERT last yr but pt never tried it.   Still having frequent UTIs this yr, sees urology. Never did vag ERT in past.   Last Pap: 03/29/18  Results were: no abnormalities /neg HPV DNA.  Hx of STDs: HSV  Last mammogram: 08/15/22 with PCP; Results were: normal--routine follow-up in 12 months There is a FH of breast cancer in her mat aunt, BRCA genetic testing not indicated. There is no FH of ovarian cancer, but there is a FH of nonmetastatic prostate cancer in 2 mat uncles, uterine cancer in her mat aunt, and colon cancer in her MGF. The patient does occas do self-breast exams. Pt has noticed a red area near RT nipple a couple times recently that resolves on its own. No d/c/ no mass.  Colonoscopy: 2023 was normal, repeat due after 10 yrs per pt. Had been Q5 yrs for several yrs. 2018 with 1 polyp.  FH colon cancer in her MGF.   Tobacco use: The patient denies current or previous tobacco use. Alcohol use: none No drug use. Exercise: mod active  She does get adequate calcium and Vitamin D in her diet. She has labs with PCP.    Past Medical History:  Diagnosis Date   Anginal pain (HCC)    Carpal tunnel syndrome    mild-mod left, mild right    Central sleep apnea    Dyspnea    had stress test and negative having another sleep study and MRI of brain   GERD (gastroesophageal reflux disease)    Headache    Herpes genitalis    HSV in her 26s   History of abnormal Pap smear    Hypertension    IBS (irritable bowel syndrome)    Migraine    Osteoarthritis    Ovarian cyst    Recurrent sinusitis     Past Surgical History:  Procedure Laterality Date   ABDOMINAL HYSTERECTOMY  2005   bilateral carpal tunnel release  2006   CERVICAL CONE BIOPSY  1986   COLONOSCOPY WITH PROPOFOL N/A 06/23/2016   Procedure: COLONOSCOPY WITH PROPOFOL;  Surgeon: Christena Deem, MD;  Location: [facility] ENDOSCOPY;  Service: Endoscopy;  Laterality: N/A;   ENDOMETRIAL ABLATION  2001   ESOPHAGOGASTRODUODENOSCOPY (EGD) WITH PROPOFOL N/A 06/23/2016   Procedure: ESOPHAGOGASTRODUODENOSCOPY (EGD) WITH PROPOFOL;  Surgeon: Christena Deem, MD;  Location: [facility] ENDOSCOPY;  Service: Endoscopy;  Laterality: N/A;   RHINOPLASTY  2004   TONSILLECTOMY  1965   TOTAL ABDOMINAL HYSTERECTOMY  2002   LSO has right ovary and tube, MAD   TUBAL LIGATION      Family History  Problem Relation Age of Onset   Hypertension Mother  Kidney disease Mother    Diabetes Father        DM Type 2   Hypertension Father    Prostate cancer Father    Other Father        Pre Leukemia   Endometriosis Sister    Asthma Brother    Leukemia Maternal Grandmother    Colon cancer Maternal Grandfather 46   Breast cancer Maternal Aunt        70's, tested and treated, has contact w her   Uterine cancer Maternal Aunt 63   Prostate cancer Maternal Uncle        no mets   Prostate cancer Maternal Uncle        no mets    Social History   Socioeconomic History   Marital status: Married    Spouse name: Not on file   Number of children: 1   Years of education: Not on file   Highest education level: Not on file  Occupational History   Not on file  Tobacco Use   Smoking status: Former     Current packs/day: 0.00    Types: Cigarettes    Quit date: 02/18/1995    Years since quitting: 27.7   Smokeless tobacco: Never  Vaping Use   Vaping status: Never Used  Substance and Sexual Activity   Alcohol use: No   Drug use: No   Sexual activity: Yes    Birth control/protection: Surgical    Comment: Hysterectomy  Other Topics Concern   Not on file  Social History Narrative   Not on file   Social Determinants of Health   Financial Resource Strain: Low Risk  (09/16/2022)   Received from [facility]   Overall Financial Resource Strain (CARDIA)    Difficulty of Paying Living Expenses: Not hard at all  Food Insecurity: No Food Insecurity (09/16/2022)   Received from [facility]   Hunger Vital Sign    Worried About Running Out of Food in the Last Year: Never true    Ran Out of Food in the Last Year: Never true  Transportation Needs: No Transportation Needs (09/16/2022)   Received [facility] - Transportation    In the past 12 months, has lack of transportation kept you from medical appointments or from getting medications?: No    Lack of Transportation (Non-Medical): No  Physical Activity: Not on file  Stress: Not on file  Social Connections: Not on file  Intimate Partner Violence: Not on file     Current Outpatient Medications:    albuterol (PROVENTIL HFA;VENTOLIN HFA) 108 (90 Base) MCG/ACT inhaler, Inhale into the lungs., Disp: , Rfl:    aluminum hydroxide-magnesium carbonate (GAVISCON) 95-358 MG/15ML SUSP, Take by mouth., Disp: , Rfl:    amLODipine (NORVASC) 2.5 MG tablet, Take 2.5 mg by mouth daily., Disp: , Rfl:    Cholecalciferol (D 1000) 25 MCG (1000 UT) capsule, Take by mouth., Disp: , Rfl:    fluticasone (FLONASE) 50 MCG/ACT nasal spray, Place 2 sprays into the nose daily., Disp: , Rfl:    metoprolol succinate (TOPROL-XL) 25 MG 24 hr tablet, TK 1/2 T PO QD, Disp: , Rfl:    mometasone (ELOCON) 0.1 %  lotion, Apply topically., Disp: , Rfl:    potassium chloride (K-DUR) 10 MEQ tablet, Take 10 mEq by mouth daily., Disp: , Rfl:    RABEprazole (ACIPHEX) 20 MG tablet, Take 20 mg by mouth daily., Disp: , Rfl:  sodium chloride (OCEAN) 0.65 % nasal spray, Place into the nose., Disp: , Rfl:    Tavaborole (KERYDIN) 5 % SOLN, Apply QHS to affected toenail, Disp: 10 mL, Rfl: 11   TRELEGY ELLIPTA 100-62.5-25 MCG/ACT AEPB, Inhale 1 puff into the lungs daily., Disp: , Rfl:    triamterene-hydrochlorothiazide (MAXZIDE-25) 37.5-25 MG per tablet, 1/2 tablet q day, Disp: , Rfl:    vitamin B-12 (CYANOCOBALAMIN) 1000 MCG tablet, Take by mouth., Disp: , Rfl:    clotrimazole-betamethasone (LOTRISONE) cream, Apply externally BID prn sx up to 2 wks, Disp: 15 g, Rfl: 1   estradiol (ESTRACE) 0.1 MG/GM vaginal cream, Insert 1 g vaginally nighty for 7 nights, then 1 g once weekly as maintenance, Disp: 42.5 g, Rfl: 1   ROS:  Review of Systems  Constitutional:  Negative for fatigue, fever and unexpected weight change.  Respiratory:  Negative for cough, shortness of breath and wheezing.   Cardiovascular:  Negative for chest pain, palpitations and leg swelling.  Gastrointestinal:  Negative for blood in stool, constipation, diarrhea, nausea and vomiting.  Endocrine: Negative for cold intolerance, heat intolerance and polyuria.  Genitourinary:  Positive for frequency. Negative for dyspareunia, dysuria, flank pain, genital sores, hematuria, menstrual problem, pelvic pain, urgency, vaginal bleeding, vaginal discharge and vaginal pain.  Musculoskeletal:  Positive for arthralgias. Negative for back pain, joint swelling and myalgias.  Skin:  Negative for rash.  Neurological:  Negative for dizziness, syncope, light-headedness, numbness and headaches.  Hematological:  Negative for adenopathy.  Psychiatric/Behavioral:  Negative for agitation, confusion, sleep disturbance and suicidal ideas. The patient is not nervous/anxious.    BREAST: tenderness   Objective: BP 113/64   Pulse (!) 57   Ht 5' 2.5" (1.588 m)   Wt 144 lb (65.3 kg)   BMI 25.92 kg/m    Physical Exam Constitutional:      Appearance: She is well-developed.  Genitourinary:     Vulva normal.     Genitourinary Comments: UTERUS/CX SURG REM     Right Labia: No rash, tenderness or lesions.    Left Labia: No tenderness, lesions or rash.    Vaginal cuff intact.    No vaginal discharge, erythema or tenderness.     Moderate vaginal atrophy present.     Right Adnexa: not tender and no mass present.    Left Adnexa: not tender and no mass present.    Cervix is not absent.     Uterus is not tender.     Uterus is not absent. Breasts:    Right: No mass, nipple discharge, skin change or tenderness.     Left: No mass, nipple discharge, skin change or tenderness.  Neck:     Thyroid: No thyromegaly.  Cardiovascular:     Rate and Rhythm: Normal rate and regular rhythm.     Heart sounds: Normal heart sounds. No murmur heard. Pulmonary:     Effort: Pulmonary effort is normal.     Breath sounds: Normal breath sounds.  Abdominal:     Palpations: Abdomen is soft.     Tenderness: There is no abdominal tenderness. There is no guarding or rebound.  Musculoskeletal:        General: Normal range of motion.     Cervical back: Normal range of motion.  Lymphadenopathy:     Cervical: No cervical adenopathy.  Neurological:     General: No focal deficit present.     Mental Status: She is alert and oriented to person, place, and time.  Cranial Nerves: No cranial nerve deficit.  Skin:    General: Skin is warm and dry.  Psychiatric:        Mood and Affect: Mood normal.        Behavior: Behavior normal.        Thought Content: Thought content normal.        Judgment: Judgment normal.  Vitals reviewed.     Assessment/Plan: Encounter for annual routine gynecological examination  Cervical cancer screening - Plan: Cytology - PAP  Encounter for  screening mammogram for malignant neoplasm of breast; pt to schedule mammo  Postmenopausal atrophic vaginitis - Plan: estradiol (ESTRACE) 0.1 MG/GM vaginal cream; Rx eRxd. Pt to use internally and small amt externally for dyspareunia and recurrent UTI.  Vaginal itching - Plan: clotrimazole-betamethasone (LOTRISONE) cream; Rx RF. Keep area dry  Frequent UTI - Plan: estradiol (ESTRACE) 0.1 MG/GM vaginal cream  Fam history of multiple cancers--doesn't qualify for Colaris/MyRisk testing. Will cont to follow FH.          Meds ordered this encounter  Medications   clotrimazole-betamethasone (LOTRISONE) cream    Sig: Apply externally BID prn sx up to 2 wks    Dispense:  15 g    Refill:  1    Order Specific Question:   Supervising Provider    Answer:   Hildred Laser [[id number]]   estradiol (ESTRACE) 0.1 MG/GM vaginal cream    Sig: Insert 1 g vaginally nighty for 7 nights, then 1 g once weekly as maintenance    Dispense:  42.5 g    Refill:  1    Order Specific Question:   Supervising Provider    Answer:   Waymon Budge    GYN counsel mammography screening, menopause, adequate intake of calcium and vitamin D, diet and exercise     F/U  Return in about 1 year (around 11/24/2023).  Ramzey Petrovic B. Makenna Macaluso, PA-C 11/24/2022 12:18 PM

## 2022-11-24 ENCOUNTER — Other Ambulatory Visit (HOSPITAL_COMMUNITY)
Admission: RE | Admit: 2022-11-24 | Discharge: 2022-11-24 | Disposition: A | Discharge status: Home / Self Care | Payer: Managed Care, Other (non HMO) | Source: Ambulatory Visit | Attending: Obstetrics and Gynecology | Admitting: Obstetrics and Gynecology

## 2022-11-24 ENCOUNTER — Encounter: Payer: Self-pay | Admitting: Obstetrics and Gynecology

## 2022-11-24 ENCOUNTER — Ambulatory Visit: Payer: Managed Care, Other (non HMO) | Admitting: Obstetrics and Gynecology

## 2022-11-24 VITALS — BP 113/64 | HR 57 | Ht 62.5 in | Wt 144.0 lb

## 2022-11-24 DIAGNOSIS — Z1211 Encounter for screening for malignant neoplasm of colon: Secondary | ICD-10-CM

## 2022-11-24 DIAGNOSIS — Z1231 Encounter for screening mammogram for malignant neoplasm of breast: Secondary | ICD-10-CM

## 2022-11-24 DIAGNOSIS — Z124 Encounter for screening for malignant neoplasm of cervix: Secondary | ICD-10-CM | POA: Insufficient documentation

## 2022-11-24 DIAGNOSIS — Z1151 Encounter for screening for human papillomavirus (HPV): Secondary | ICD-10-CM

## 2022-11-24 DIAGNOSIS — Z01419 Encounter for gynecological examination (general) (routine) without abnormal findings: Secondary | ICD-10-CM | POA: Diagnosis not present

## 2022-11-24 DIAGNOSIS — N898 Other specified noninflammatory disorders of vagina: Secondary | ICD-10-CM

## 2022-11-24 DIAGNOSIS — N390 Urinary tract infection, site not specified: Secondary | ICD-10-CM

## 2022-11-24 DIAGNOSIS — N952 Postmenopausal atrophic vaginitis: Secondary | ICD-10-CM

## 2022-11-24 MED ORDER — CLOTRIMAZOLE-BETAMETHASONE 1-0.05 % EX CREA: TOPICAL_CREAM | CUTANEOUS | 1 refills | Status: AC

## 2022-11-24 MED ORDER — ESTRADIOL 0.1 MG/GM VA CREA: TOPICAL_CREAM | VAGINAL | 1 refills | Status: AC

## 2022-11-24 NOTE — Patient Instructions (Signed)
I value your feedback and you entrusting us with your care. If you get a [facility] patient survey, I would appreciate you taking the time to let us know about your experience today. Thank you! ? ? ?

## 2022-11-25 ENCOUNTER — Other Ambulatory Visit: Payer: Self-pay | Admitting: Specialist

## 2022-11-25 DIAGNOSIS — R911 Solitary pulmonary nodule: Secondary | ICD-10-CM

## 2022-11-27 LAB — CYTOLOGY - PAP: Diagnosis: NEGATIVE

## 2022-12-01 ENCOUNTER — Ambulatory Visit
Admission: RE | Admit: 2022-12-01 | Discharge: 2022-12-01 | Disposition: A | Discharge status: Home / Self Care | Payer: Managed Care, Other (non HMO) | Source: Ambulatory Visit | Attending: Specialist | Admitting: Specialist

## 2022-12-01 DIAGNOSIS — R911 Solitary pulmonary nodule: Secondary | ICD-10-CM

## 2022-12-08 ENCOUNTER — Ambulatory Visit: Payer: Managed Care, Other (non HMO) | Admitting: Urology

## 2022-12-08 ENCOUNTER — Encounter: Payer: Self-pay | Admitting: Urology

## 2022-12-08 DIAGNOSIS — R3915 Urgency of urination: Secondary | ICD-10-CM

## 2022-12-08 LAB — URINALYSIS, COMPLETE
Bilirubin, UA: NEGATIVE
Glucose, UA: NEGATIVE
Ketones, UA: NEGATIVE
Leukocytes,UA: NEGATIVE
Nitrite, UA: NEGATIVE
Protein,UA: NEGATIVE
Specific Gravity, UA: >1.03 — ABNORMAL HIGH (ref 1.005–1.030)
Urobilinogen, Ur: 0.2 mg/dL (ref 0.2–1.0)
pH, UA: 5 (ref 5.0–7.5)

## 2022-12-08 LAB — MICROSCOPIC EXAMINATION

## 2022-12-08 NOTE — Progress Notes (Signed)
12/08/2022 3:29 PM   Debra Dean [birth year] [id number]  Referring provider: Marisue Ivan, MD [phone] [facility] [address] [facility] [place],  [place] [address]  No chief complaint on file.   HPI: Reviewed the chart.  Had lengthy discussions regarding daily prophylaxis versus treating infections as needed.  Thought she may have get short of breath on Macrodantin and I did not think sleepiness was from sulfa.  She has been on daily trimethoprim.  Had many positive cultures.  I last saw the patient a year ago and culture was negative  Says she has had some bladder infection but needed 2 rounds of antibiotics.  She says she has had 2 but possibly up to 4 infections this year.  Once again she asked me about trimethoprim Urine reviewed and sent for culture  Not think she is infected now and she is not having nighttime frequency   Multiple questions answered.  She is moving to [place].  PMH: Past Medical History:  Diagnosis Date   Anginal pain (HCC)    Carpal tunnel syndrome    mild-mod left, mild right   Central sleep apnea    Dyspnea    had stress test and negative having another sleep study and MRI of brain   GERD (gastroesophageal reflux disease)    Headache    Herpes genitalis    HSV in her 47s   History of abnormal Pap smear    Hypertension    IBS (irritable bowel syndrome)    Migraine    Osteoarthritis    Ovarian cyst    Recurrent sinusitis     Surgical History: Past Surgical History:  Procedure Laterality Date   ABDOMINAL HYSTERECTOMY  2005   bilateral carpal tunnel release  2006   CERVICAL CONE BIOPSY  1986   COLONOSCOPY WITH PROPOFOL N/A 06/23/2016   Procedure: COLONOSCOPY WITH PROPOFOL;  Surgeon: Christena Deem, MD;  Location: [facility] ENDOSCOPY;  Service: Endoscopy;  Laterality: N/A;   ENDOMETRIAL ABLATION  2001   ESOPHAGOGASTRODUODENOSCOPY (EGD) WITH PROPOFOL N/A 06/23/2016   Procedure: ESOPHAGOGASTRODUODENOSCOPY (EGD) WITH PROPOFOL;  Surgeon:  Christena Deem, MD;  Location: [facility] ENDOSCOPY;  Service: Endoscopy;  Laterality: N/A;   RHINOPLASTY  2004   TONSILLECTOMY  1965   TOTAL ABDOMINAL HYSTERECTOMY  2002   LSO has right ovary and tube, MAD   TUBAL LIGATION      Home Medications:  Allergies as of 12/08/2022       Reactions   Codeine    Nitrofurantoin Other (See Comments)   Shortness of breath   Ace Inhibitors Rash   Hydrocodone Bit-homatrop Mbr Nausea Only, Nausea And Vomiting        Medication List        Accurate as of December 08, 2022  3:29 PM. If you have any questions, ask your nurse or doctor.          albuterol 108 (90 Base) MCG/ACT inhaler Commonly known as: VENTOLIN HFA Inhale into the lungs.   aluminum hydroxide-magnesium carbonate 95-358 MG/15ML Susp Commonly known as: GAVISCON Take by mouth.   amLODipine 2.5 MG tablet Commonly known as: NORVASC Take 2.5 mg by mouth daily.   clotrimazole-betamethasone cream Commonly known as: LOTRISONE Apply externally BID prn sx up to 2 wks   cyanocobalamin 1000 MCG tablet Commonly known as: VITAMIN B12 Take by mouth.   D 1000 25 MCG (1000 UT) capsule Generic drug: Cholecalciferol Take by mouth.   estradiol 0.1 MG/GM vaginal cream Commonly known  as: ESTRACE Insert 1 g vaginally nighty for 7 nights, then 1 g once weekly as maintenance   fluticasone 50 MCG/ACT nasal spray Commonly known as: FLONASE Place 2 sprays into the nose daily.   metoprolol succinate 25 MG 24 hr tablet Commonly known as: TOPROL-XL TK 1/2 T PO QD   mometasone 0.1 % lotion Commonly known as: ELOCON Apply topically.   potassium chloride 10 MEQ tablet Commonly known as: KLOR-CON Take 10 mEq by mouth daily.   RABEprazole 20 MG tablet Commonly known as: ACIPHEX Take 20 mg by mouth daily.   sodium chloride 0.65 % nasal spray Commonly known as: OCEAN Place into the nose.   Tavaborole 5 % Soln Commonly known as: Kerydin Apply QHS to affected toenail   Trelegy  Ellipta 100-62.5-25 MCG/ACT Aepb Generic drug: Fluticasone-Umeclidin-Vilant Inhale 1 puff into the lungs daily.   triamterene-hydrochlorothiazide 37.5-25 MG tablet Commonly known as: MAXZIDE-25 1/2 tablet q day        Allergies:  Allergies  Allergen Reactions   Codeine    Nitrofurantoin Other (See Comments)    Shortness of breath   Ace Inhibitors Rash   Hydrocodone Bit-Homatrop Mbr Nausea Only and Nausea And Vomiting    Family History: Family History  Problem Relation Age of Onset   Hypertension Mother    Kidney disease Mother    Diabetes Father        DM Type 2   Hypertension Father    Prostate cancer Father    Other Father        Pre Leukemia   Endometriosis Sister    Asthma Brother    Leukemia Maternal Grandmother    Colon cancer Maternal Grandfather 48   Breast cancer Maternal Aunt        70's, tested and treated, has contact w her   Uterine cancer Maternal Aunt 63   Prostate cancer Maternal Uncle        no mets   Prostate cancer Maternal Uncle        no mets    Social History:  reports that she quit smoking about 27 years ago. Her smoking use included cigarettes. She has never used smokeless tobacco. She reports that she does not drink alcohol and does not use drugs.  ROS:                                        Physical Exam: There were no vitals taken for this visit.  Constitutional:  Alert and oriented, No acute distress. HEENT: [place] AT, moist mucus membranes.  Trachea midline, no masses.   Laboratory Data: No results found for: "WBC", "HGB", "HCT", "MCV", "PLT"  Lab Results  Component Value Date   CREATININE 0.70 01/23/2021    No results found for: "PSA"  No results found for: "TESTOSTERONE"  No results found for: "HGBA1C"  Urinalysis    Component Value Date/Time   APPEARANCEUR Clear 09/09/2021 0906   GLUCOSEU Negative 09/09/2021 0906   BILIRUBINUR Negative 09/09/2021 0906   PROTEINUR Negative 09/09/2021 0906    NITRITE Negative 09/09/2021 0906   LEUKOCYTESUR Negative 09/09/2021 0906    Pertinent Imaging:   Assessment & Plan: I recommended to treat infections as needed.  If it is obvious she gets another bladder infection and wants: Prophylaxis I will call in the prophylactic medication.  Multiple questions answered.  See as needed.  She is now on local  estrogen which hopefully can reduce infections and this was discussed  1. Urinary urgency  - Urinalysis, Complete   No follow-ups on file.  Martina Sinner, MD  [facility] [address], [address] [place], [place] [address] [phone]

## 2022-12-11 LAB — CULTURE, URINE COMPREHENSIVE

## 2023-05-07 ENCOUNTER — Encounter: Payer: Managed Care, Other (non HMO) | Admitting: Dermatology

## 2023-06-23 ENCOUNTER — Other Ambulatory Visit (HOSPITAL_BASED_OUTPATIENT_CLINIC_OR_DEPARTMENT_OTHER): Payer: Self-pay

## 2023-10-05 ENCOUNTER — Other Ambulatory Visit: Payer: Self-pay | Admitting: Family Medicine

## 2023-10-05 DIAGNOSIS — Z1231 Encounter for screening mammogram for malignant neoplasm of breast: Secondary | ICD-10-CM

## 2023-10-28 ENCOUNTER — Other Ambulatory Visit: Payer: Self-pay | Admitting: Specialist

## 2023-10-28 DIAGNOSIS — J449 Chronic obstructive pulmonary disease, unspecified: Secondary | ICD-10-CM

## 2023-10-28 DIAGNOSIS — R918 Other nonspecific abnormal finding of lung field: Secondary | ICD-10-CM

## 2023-12-30 ENCOUNTER — Ambulatory Visit
Admission: RE | Admit: 2023-12-30 | Discharge: 2023-12-30 | Disposition: A | Discharge status: Home / Self Care | Source: Ambulatory Visit | Attending: Specialist | Admitting: Specialist

## 2023-12-30 DIAGNOSIS — R918 Other nonspecific abnormal finding of lung field: Secondary | ICD-10-CM

## 2023-12-31 ENCOUNTER — Ambulatory Visit
Admission: RE | Admit: 2023-12-31 | Discharge: 2023-12-31 | Disposition: A | Discharge status: Home / Self Care | Source: Ambulatory Visit | Attending: Family Medicine | Admitting: Family Medicine

## 2023-12-31 ENCOUNTER — Other Ambulatory Visit

## 2023-12-31 DIAGNOSIS — Z1231 Encounter for screening mammogram for malignant neoplasm of breast: Secondary | ICD-10-CM | POA: Insufficient documentation

## 2024-01-05 ENCOUNTER — Other Ambulatory Visit: Payer: Self-pay | Admitting: Family Medicine

## 2024-01-05 DIAGNOSIS — R7303 Prediabetes: Secondary | ICD-10-CM

## 2024-01-05 DIAGNOSIS — I700 Atherosclerosis of aorta: Secondary | ICD-10-CM

## 2024-02-02 ENCOUNTER — Other Ambulatory Visit: Payer: Self-pay | Admitting: Obstetrics and Gynecology

## 2024-02-02 DIAGNOSIS — N952 Postmenopausal atrophic vaginitis: Secondary | ICD-10-CM

## 2024-02-02 DIAGNOSIS — N390 Urinary tract infection, site not specified: Secondary | ICD-10-CM

## 2024-02-03 ENCOUNTER — Other Ambulatory Visit (HOSPITAL_BASED_OUTPATIENT_CLINIC_OR_DEPARTMENT_OTHER): Payer: Self-pay

## 2024-02-03 ENCOUNTER — Inpatient Hospital Stay
Admission: RE | Admit: 2024-02-03 | Discharge: 2024-02-03 | Payer: Self-pay | Attending: Family Medicine | Admitting: Family Medicine

## 2024-02-03 DIAGNOSIS — I700 Atherosclerosis of aorta: Secondary | ICD-10-CM | POA: Insufficient documentation

## 2024-02-03 DIAGNOSIS — R7303 Prediabetes: Secondary | ICD-10-CM | POA: Insufficient documentation
